# Patient Record
Sex: Female | Born: 1975 | Race: White | Hispanic: No | Marital: Married | Smoking: Never smoker
Health system: Southern US, Community
[De-identification: ages and names within clinical notes are randomized; demographics above are authoritative.]

## PROBLEM LIST (undated history)

## (undated) DIAGNOSIS — J302 Other seasonal allergic rhinitis: Secondary | ICD-10-CM

## (undated) DIAGNOSIS — D497 Neoplasm of unspecified behavior of endocrine glands and other parts of nervous system: Secondary | ICD-10-CM

## (undated) DIAGNOSIS — G43909 Migraine, unspecified, not intractable, without status migrainosus: Secondary | ICD-10-CM

## (undated) DIAGNOSIS — D259 Leiomyoma of uterus, unspecified: Secondary | ICD-10-CM

## (undated) DIAGNOSIS — Z973 Presence of spectacles and contact lenses: Secondary | ICD-10-CM

## (undated) HISTORY — DX: Migraine, unspecified, not intractable, without status migrainosus: G43.909

## (undated) HISTORY — PX: WISDOM TOOTH EXTRACTION: SHX21

---

## 2000-09-26 ENCOUNTER — Other Ambulatory Visit: Admission: RE | Admit: 2000-09-26 | Discharge: 2000-09-26 | Payer: Self-pay | Admitting: Obstetrics and Gynecology

## 2001-01-31 ENCOUNTER — Other Ambulatory Visit: Admission: RE | Admit: 2001-01-31 | Discharge: 2001-01-31 | Payer: Self-pay | Admitting: Obstetrics and Gynecology

## 2001-03-19 ENCOUNTER — Other Ambulatory Visit: Admission: RE | Admit: 2001-03-19 | Discharge: 2001-03-19 | Payer: Self-pay | Admitting: Obstetrics and Gynecology

## 2002-06-02 ENCOUNTER — Other Ambulatory Visit: Admission: RE | Admit: 2002-06-02 | Discharge: 2002-06-02 | Payer: Self-pay | Admitting: Obstetrics and Gynecology

## 2003-04-01 ENCOUNTER — Inpatient Hospital Stay (HOSPITAL_COMMUNITY): Admission: AD | Admit: 2003-04-01 | Discharge: 2003-04-02 | Payer: Self-pay | Admitting: Obstetrics and Gynecology

## 2003-04-02 ENCOUNTER — Inpatient Hospital Stay (HOSPITAL_COMMUNITY): Admission: AD | Admit: 2003-04-02 | Discharge: 2003-04-05 | Payer: Self-pay | Admitting: Obstetrics and Gynecology

## 2003-05-04 ENCOUNTER — Other Ambulatory Visit: Admission: RE | Admit: 2003-05-04 | Discharge: 2003-05-04 | Payer: Self-pay | Admitting: Obstetrics & Gynecology

## 2004-11-01 ENCOUNTER — Inpatient Hospital Stay (HOSPITAL_COMMUNITY): Admission: AD | Admit: 2004-11-01 | Discharge: 2004-11-03 | Payer: Self-pay | Admitting: Obstetrics and Gynecology

## 2004-12-04 ENCOUNTER — Other Ambulatory Visit: Admission: RE | Admit: 2004-12-04 | Discharge: 2004-12-04 | Payer: Self-pay | Admitting: Obstetrics & Gynecology

## 2013-08-27 ENCOUNTER — Encounter: Payer: Self-pay | Admitting: Gastroenterology

## 2013-09-10 ENCOUNTER — Encounter: Payer: Self-pay | Admitting: Gastroenterology

## 2013-09-10 ENCOUNTER — Ambulatory Visit (INDEPENDENT_AMBULATORY_CARE_PROVIDER_SITE_OTHER): Payer: BC Managed Care – PPO | Admitting: Gastroenterology

## 2013-09-10 VITALS — BP 112/78 | HR 68 | Ht 65.0 in | Wt 162.0 lb

## 2013-09-10 DIAGNOSIS — R0989 Other specified symptoms and signs involving the circulatory and respiratory systems: Secondary | ICD-10-CM

## 2013-09-10 DIAGNOSIS — F458 Other somatoform disorders: Secondary | ICD-10-CM

## 2013-09-10 DIAGNOSIS — R09A2 Foreign body sensation, throat: Secondary | ICD-10-CM | POA: Insufficient documentation

## 2013-09-10 NOTE — Progress Notes (Signed)
    _                                                                                                                History of Present Illness: Pleasant 38 year old white female self-referred for evaluation of globus.  She had an upper respiratory infection a couple months ago for which she ultimately took antibiotics.  Since that time she's had a constant need to clear her throat.  She has a sense of fullness in her lower throat.  She initially had a nonproductive cough.  She took Nexium for 4 days and did not notice much change.  She developed severe headache and nausea.  She rarely has pyrosis.  She denies dysphagia or sore throat.  She has no antecedent GI or upper respiratory complaints.  She complains of excess gas related to certain foods.   Past Medical History  Diagnosis Date  . Migraines    Past Surgical History  Procedure Laterality Date  . Wisdom tooth extraction     family history includes Bladder Cancer in her paternal grandmother; Breast cancer in her maternal aunt and paternal grandmother; Diabetes in her maternal grandmother; Heart disease in her maternal grandfather and paternal grandmother; Thyroid cancer in her maternal aunt and paternal uncle. Current Outpatient Prescriptions  Medication Sig Dispense Refill  . Multiple Minerals-Vitamins (CALCIUM & VIT D3 BONE HEALTH PO) Take 1 capsule by mouth daily.      . Probiotic Product (PROBIOTIC DAILY PO) Take 1 capsule by mouth daily.       No current facility-administered medications for this visit.   Allergies as of 09/10/2013  . (No Known Allergies)    reports that she has never smoked. She has never used smokeless tobacco. She reports that she does not drink alcohol or use illicit drugs.     Review of Systems: Pertinent positive and negative review of systems were noted in the above HPI section. All other review of systems were otherwise negative.  Vital signs were reviewed in today's medical  record Physical Exam: General: Well developed , well nourished, no acute distress Skin: anicteric Head: Normocephalic and atraumatic Eyes:  sclerae anicteric, EOMI Ears: Normal auditory acuity Mouth: No deformity or lesions Neck: Supple, no masses or thyromegaly Lungs: Clear throughout to auscultation Heart: Regular rate and rhythm; no murmurs, rubs or bruits Abdomen: Soft, non tender and non distended. No masses, hepatosplenomegaly or hernias noted. Normal Bowel sounds Rectal:deferred Musculoskeletal: Symmetrical with no gross deformities  Skin: No lesions on visible extremities Pulses:  Normal pulses noted Extremities: No clubbing, cyanosis, edema or deformities noted Neurological: Alert oriented x 4, grossly nonfocal Cervical Nodes:  No significant cervical adenopathy Inguinal Nodes: No significant inguinal adenopathy Psychological:  Alert and cooperative. Normal mood and affect  See Assessment and Plan under Problem List

## 2013-09-10 NOTE — Assessment & Plan Note (Signed)
Symptoms may be do to pharyngeal irritation.  Esophageal reflux is a consideration although she has no other symptoms suggestive of reflux including pyrosis, sore throat or coughing.  Short course of Nexium did not improve her symptoms and she had headache and nausea which could have been related to Nexium.  She does not appear particularly anxious.   Recommendations #1 trial of guafenesin.  If she's not improved after 4-5 days I will empirically try her on another PPI.  Finally, if she is unresponsive to either therapy I will proceed with upper endoscopy with bravo 48-hour pH study off medications

## 2013-09-10 NOTE — Patient Instructions (Signed)
Use guaifenesin OTC  Call in one week to report progres

## 2015-08-14 HISTORY — PX: OTHER SURGICAL HISTORY: SHX169

## 2016-07-02 ENCOUNTER — Other Ambulatory Visit: Payer: Self-pay | Admitting: Family Medicine

## 2016-07-02 DIAGNOSIS — E041 Nontoxic single thyroid nodule: Secondary | ICD-10-CM

## 2016-07-11 ENCOUNTER — Ambulatory Visit
Admission: RE | Admit: 2016-07-11 | Discharge: 2016-07-11 | Disposition: A | Discharge status: Home / Self Care | Payer: BLUE CROSS/BLUE SHIELD | Source: Ambulatory Visit | Attending: Family Medicine | Admitting: Family Medicine

## 2016-07-11 DIAGNOSIS — E041 Nontoxic single thyroid nodule: Secondary | ICD-10-CM

## 2016-07-13 ENCOUNTER — Other Ambulatory Visit: Payer: Self-pay | Admitting: Family Medicine

## 2016-07-13 DIAGNOSIS — E041 Nontoxic single thyroid nodule: Secondary | ICD-10-CM

## 2016-07-18 ENCOUNTER — Ambulatory Visit
Admission: RE | Admit: 2016-07-18 | Discharge: 2016-07-18 | Disposition: A | Discharge status: Home / Self Care | Payer: BLUE CROSS/BLUE SHIELD | Source: Ambulatory Visit | Attending: Family Medicine | Admitting: Family Medicine

## 2016-07-18 ENCOUNTER — Other Ambulatory Visit (HOSPITAL_COMMUNITY)
Admission: RE | Admit: 2016-07-18 | Discharge: 2016-07-18 | Disposition: A | Discharge status: Home / Self Care | Payer: BLUE CROSS/BLUE SHIELD | Source: Ambulatory Visit | Attending: Radiology | Admitting: Radiology

## 2016-07-18 DIAGNOSIS — E041 Nontoxic single thyroid nodule: Secondary | ICD-10-CM | POA: Diagnosis not present

## 2016-07-18 DIAGNOSIS — D440 Neoplasm of uncertain behavior of thyroid gland: Secondary | ICD-10-CM | POA: Insufficient documentation

## 2016-07-23 ENCOUNTER — Ambulatory Visit: Payer: Self-pay | Admitting: Surgery

## 2016-08-10 ENCOUNTER — Encounter (HOSPITAL_COMMUNITY): Payer: Self-pay | Admitting: *Deleted

## 2016-08-13 DIAGNOSIS — C73 Malignant neoplasm of thyroid gland: Secondary | ICD-10-CM

## 2016-08-13 DIAGNOSIS — E890 Postprocedural hypothyroidism: Secondary | ICD-10-CM

## 2016-08-13 HISTORY — DX: Postprocedural hypothyroidism: E89.0

## 2016-08-13 HISTORY — DX: Malignant neoplasm of thyroid gland: C73

## 2016-08-22 DIAGNOSIS — Z808 Family history of malignant neoplasm of other organs or systems: Secondary | ICD-10-CM | POA: Diagnosis not present

## 2016-08-22 DIAGNOSIS — D224 Melanocytic nevi of scalp and neck: Secondary | ICD-10-CM | POA: Diagnosis not present

## 2016-08-22 DIAGNOSIS — D225 Melanocytic nevi of trunk: Secondary | ICD-10-CM | POA: Diagnosis not present

## 2016-08-22 DIAGNOSIS — Z86018 Personal history of other benign neoplasm: Secondary | ICD-10-CM | POA: Diagnosis not present

## 2016-08-23 NOTE — Patient Instructions (Signed)
Diane Russell  08/23/2016   Your procedure is scheduled [id number] 08/29/2015   Report to [facility] Main  Entrance take [place]  elevators to 3rd floor to  [place] at   [place]  AM.  Call this number if you have problems the morning of surgery [phone]   Remember: ONLY 1 PERSON MAY GO WITH YOU TO SHORT STAY TO GET  READY MORNING OF [place].   Do not eat food or drink liquids :After Midnight.     Take these medicines the morning of surgery with A SIP OF WATER: Flonase nasal spray                                 You may not have any metal on your body including hair pins and              piercings  Do not wear jewelry, make-up, lotions, powders or perfumes, deodorant             Do not wear nail polish.  Do not shave  48 hours prior to surgery.              Men may shave face and neck.   Do not bring valuables to the hospital. [place].  Contacts, dentures or bridgework may not be worn into surgery.  Leave suitcase in the car. After surgery it may be brought to your room.                  Please read over the following fact sheets you were given: _____________________________________________________________________             [facility] - Preparing for Surgery Before surgery, you can play an important role.  Because skin is not sterile, your skin needs to be as free of germs as possible.  You can reduce the number of germs on your skin by washing with CHG (chlorahexidine gluconate) soap before surgery.  CHG is an antiseptic cleaner which kills germs and bonds with the skin to continue killing germs even after washing. Please DO NOT use if you have an allergy to CHG or antibacterial soaps.  If your skin becomes reddened/irritated stop using the CHG and inform your nurse when you arrive at Short Stay. Do not shave (including legs and underarms) for at least 48 hours prior to the first  CHG shower.  You may shave your face/neck. Please follow these instructions carefully:  1.  Shower with CHG Soap the night before surgery and the  morning of Surgery.  2.  If you choose to wash your hair, wash your hair first as usual with your  normal  shampoo.  3.  After you shampoo, rinse your hair and body thoroughly to remove the  shampoo.                           4.  Use CHG as you would any other liquid soap.  You can apply chg directly  to the skin and wash                       Gently with a scrungie or clean  washcloth.  5.  Apply the CHG Soap to your body ONLY FROM THE NECK DOWN.   Do not use on face/ open                           Wound or open sores. Avoid contact with eyes, ears mouth and genitals (private parts).                       Wash face,  Genitals (private parts) with your normal soap.             6.  Wash thoroughly, paying special attention to the area where your surgery  will be performed.  7.  Thoroughly rinse your body with warm water from the neck down.  8.  DO NOT shower/wash with your normal soap after using and rinsing off  the CHG Soap.                9.  Pat yourself dry with a clean towel.            10.  Wear clean pajamas.            11.  Place clean sheets on your bed the night of your first shower and do not  sleep with pets. Day of Surgery : Do not apply any lotions/deodorants the morning of surgery.  Please wear clean clothes to the hospital/surgery center.  FAILURE TO FOLLOW THESE INSTRUCTIONS MAY RESULT IN THE CANCELLATION OF YOUR SURGERY PATIENT SIGNATURE_________________________________  NURSE SIGNATURE__________________________________  ________________________________________________________________________

## 2016-08-24 ENCOUNTER — Ambulatory Visit (HOSPITAL_COMMUNITY)
Admission: RE | Admit: 2016-08-24 | Discharge: 2016-08-24 | Disposition: A | Discharge status: Home / Self Care | Payer: BLUE CROSS/BLUE SHIELD | Source: Ambulatory Visit | Attending: Anesthesiology | Admitting: Anesthesiology

## 2016-08-24 ENCOUNTER — Encounter (HOSPITAL_COMMUNITY): Payer: Self-pay

## 2016-08-24 ENCOUNTER — Encounter (HOSPITAL_COMMUNITY)
Admission: RE | Admit: 2016-08-24 | Discharge: 2016-08-24 | Disposition: A | Discharge status: Home / Self Care | Payer: BLUE CROSS/BLUE SHIELD | Source: Ambulatory Visit | Attending: Surgery | Admitting: Surgery

## 2016-08-24 DIAGNOSIS — Z01811 Encounter for preprocedural respiratory examination: Secondary | ICD-10-CM | POA: Diagnosis not present

## 2016-08-24 DIAGNOSIS — D440 Neoplasm of uncertain behavior of thyroid gland: Secondary | ICD-10-CM | POA: Insufficient documentation

## 2016-08-24 DIAGNOSIS — Z01812 Encounter for preprocedural laboratory examination: Secondary | ICD-10-CM | POA: Insufficient documentation

## 2016-08-24 DIAGNOSIS — Z01818 Encounter for other preprocedural examination: Secondary | ICD-10-CM | POA: Diagnosis not present

## 2016-08-24 HISTORY — DX: Neoplasm of unspecified behavior of endocrine glands and other parts of nervous system: D49.7

## 2016-08-24 LAB — CBC
HEMATOCRIT: 40.9 % (ref 36.0–46.0)
Hemoglobin: 13.7 g/dL (ref 12.0–15.0)
MCH: 28.1 pg (ref 26.0–34.0)
MCHC: 33.5 g/dL (ref 30.0–36.0)
MCV: 83.8 fL (ref 78.0–100.0)
Platelets: 302 10*3/uL (ref 150–400)
RBC: 4.88 MIL/uL (ref 3.87–5.11)
RDW: 12.5 % (ref 11.5–15.5)
WBC: 4.7 10*3/uL (ref 4.0–10.5)

## 2016-08-24 LAB — HCG, SERUM, QUALITATIVE: PREG SERUM: NEGATIVE

## 2016-08-27 ENCOUNTER — Encounter (HOSPITAL_COMMUNITY): Payer: Self-pay | Admitting: Surgery

## 2016-08-27 DIAGNOSIS — D440 Neoplasm of uncertain behavior of thyroid gland: Secondary | ICD-10-CM | POA: Diagnosis present

## 2016-08-27 NOTE — Anesthesia Preprocedure Evaluation (Signed)
Anesthesia Evaluation  Patient identified by MRN, date of birth, ID band Patient awake    Reviewed: Allergy & Precautions, H&P , Patient's Chart, lab work & pertinent test results, reviewed documented beta blocker date and time   Airway Mallampati: II  TM Distance: >3 FB Neck ROM: full    Dental no notable dental hx.    Pulmonary    Pulmonary exam normal breath sounds clear to auscultation       Cardiovascular  Rhythm:regular Rate:Normal     Neuro/Psych    GI/Hepatic   Endo/Other    Renal/GU      Musculoskeletal   Abdominal   Peds  Hematology   Anesthesia Other Findings o/w healthy  Reproductive/Obstetrics                             Anesthesia Physical Anesthesia Plan  ASA: II  Anesthesia Plan: General   Post-op Pain Management:    Induction: Intravenous  Airway Management Planned: Oral ETT  Additional Equipment:   Intra-op Plan:   Post-operative Plan: Extubation in OR  Informed Consent: I have reviewed the patients History and Physical, chart, labs and discussed the procedure including the risks, benefits and alternatives for the proposed anesthesia with the patient or authorized representative who has indicated his/her understanding and acceptance.   Dental Advisory Given and Dental advisory given  Plan Discussed with: CRNA and Surgeon  Anesthesia Plan Comments: (  Discussed general anesthesia, including possible nausea, instrumentation of airway, sore throat,pulmonary aspiration, etc. I asked if the were any outstanding questions, or  concerns before we proceeded.)        Anesthesia Quick Evaluation

## 2016-08-27 NOTE — H&P (Signed)
General Surgery [facility]  Diane Russell DOB: May 06, 1976 Married / Language: English / Race: White Female  History of Present Illness   The patient is a 41 year old female who presents with a thyroid nodule.  Patient is referred by Dr. Ulanda Edison for evaluation of right thyroid nodule suspicious for malignancy. Patient first noted a thyroid nodule on self-examination in November 2017. She notified her primary care physician and an ultrasound was obtained on July 11, 2016. This demonstrated a normal size thyroid gland with essentially a solitary nodule in the mid right thyroid lobe measuring 2.0 cm in greatest dimension. Fine-needle aspiration biopsy was carried out on July 18, 2016. This showed a suspicion of malignancy, Bethesda category V. Patient presents today to discuss surgical resection for definitive diagnosis. She is accompanied by her husband. 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 Patient has never been on thyroid medication. She has had no prior head or neck surgery. Patient denies any compressive symptoms. She denies tremors or palpitations. TSH level is normal at 1.79. Patient does have a family history of thyroid disease with her mother being treated for nodules, a maternal aunt with papillary thyroid cancer, and a maternal uncle with an unknown head and neck tumor. Patient presents today to discuss surgical resection for definitive diagnosis.   Past Surgical History Oral Surgery   Diagnostic Studies History  Colonoscopy  never Mammogram  within last year Pap Smear  1-5 years ago  Allergies  No Known Drug Allergies 07/23/2016  Medication History Magnesium (500MG  Tablet, Oral) Active. Medications Reconciled  Social History Alcohol use  Occasional alcohol use. Caffeine use  Tea. No drug use  Tobacco use  Never smoker.  Family History  Breast Cancer  Family Members In General. Cancer   Family Members In General. Diabetes Mellitus  Family Members In General, Father. Heart Disease  Family Members In General. Heart disease in female family member before age 42  Hypertension  Family Members In General, Father. Melanoma  Family Members In General. Thyroid problems  Mother.  Pregnancy / Birth History Age at menarche  78 years. Contraceptive History  Intrauterine device. Gravida  2 Length (months) of breastfeeding  3-6 Maternal age  54-30 Para  2 Regular periods   Other Problems  Hypercholesterolemia  Thyroid Disease   Review of Systems  General Present- Fatigue, Night Sweats and Weight Gain. Not Present- Appetite Loss, Chills, Fever and Weight Loss. Skin Present- Dryness. Not Present- Change in Wart/Mole, Hives, Jaundice, New Lesions, Non-Healing Wounds, Rash and Ulcer. HEENT Present- Seasonal Allergies, Sinus Pain and Wears glasses/contact lenses. Not Present- Earache, Hearing Loss, Hoarseness, Nose Bleed, Oral Ulcers, Ringing in the Ears, Sore Throat, Visual Disturbances and Yellow Eyes. Cardiovascular Present- Leg Cramps. Not Present- Chest Pain, Difficulty Breathing Lying Down, Palpitations, Rapid Heart Rate, Shortness of Breath and Swelling of Extremities. Gastrointestinal Not Present- Abdominal Pain, Bloating, Bloody Stool, Change in Bowel Habits, Chronic diarrhea, Constipation, Difficulty Swallowing, Excessive gas, Gets full quickly at meals, Hemorrhoids, Indigestion, Nausea, Rectal Pain and Vomiting. Female Genitourinary Not Present- Frequency, Nocturia, Painful Urination, Pelvic Pain and Urgency. Musculoskeletal Not Present- Back Pain, Joint Pain, Joint Stiffness, Muscle Pain, Muscle Weakness and Swelling of Extremities. Neurological Present- Headaches. Not Present- Decreased Memory, Fainting, Numbness, Seizures, Tingling, Tremor, Trouble walking and Weakness. Psychiatric Not Present- Anxiety, Bipolar, Change in Sleep Pattern, Depression, Fearful and  Frequent crying. Endocrine Present- Cold Intolerance and Hair Changes. Not Present- Excessive Hunger, Heat Intolerance, Hot flashes and  New Diabetes.  Vitals  Weight: 161.4 lb Height: 65in Body Surface Area: 1.81 m Body Mass Index: 26.86 kg/m  Temp.: 98.30F(Oral)  Pulse: 83 (Regular)  BP: 118/74 (Sitting, Left Arm, Standard)  Physical Exam The physical exam findings are as follows: Note:General - appears comfortable, no distress; not diaphorectic  HEENT - normocephalic; sclerae clear, gaze conjugate; mucous membranes moist, dentition good; voice normal  Neck - asymmetric on extension; no palpable anterior or posterior cervical adenopathy; left lobe is normal to palpation; in the mid anterior right thyroid lobe is a 2 cm mass which is smooth, relatively soft, mobile, and nontender; there are no other palpable masses noted.  Chest - clear bilaterally without rhonchi, rales, or wheeze  Cor - regular rhythm with normal rate; no significant murmur  Ext - non-tender without significant edema or lymphedema  Neuro - grossly intact; no tremor    Assessment & Plan  NEOPLASM OF UNCERTAIN BEHAVIOR OF THYROID GLAND (D44.0)  Pt Education - Pamphlet Given - The Thyroid Book: discussed with patient and provided information. Patient presents today accompanied by her husband to discuss right thyroid nodule suspicious for malignancy. Patient is provided with written literature on thyroid surgery to review at home.  We reviewed her thyroid ultrasound and fine-needle aspiration cytopathology report in detail. Copies are provided to the patient. We discussed options for surgical management including right thyroid lobectomy versus total thyroidectomy with possible lymph node dissection. We discussed the risk and benefits of each including the potential for recurrent laryngeal nerve injury and injury to parathyroid glands. After discussion, the patient was like to proceed with right  thyroid lobectomy. She is aware that additional surgery may be required based on final pathology results. We discussed the hospital stay to be anticipated. We discussed the surgical incision and the cosmetic results to be anticipated. We discussed the need for consultation with endocrinology following surgery. I have recommended Dr. Delrae Rend in the [place] group. Patient understands and agrees to proceed.  The risks and benefits of the procedure have been discussed at length with the patient. The patient understands the proposed procedure, potential alternative treatments, and the course of recovery to be expected. All of the patient's questions have been answered at this time. The patient wishes to proceed with surgery.  Earnstine Regal, MD, [facility] Office: [phone]

## 2016-08-28 ENCOUNTER — Ambulatory Visit (HOSPITAL_COMMUNITY): Payer: BLUE CROSS/BLUE SHIELD | Admitting: Anesthesiology

## 2016-08-28 ENCOUNTER — Encounter (HOSPITAL_COMMUNITY)
Admission: RE | Disposition: A | Discharge status: Home / Self Care | Payer: Self-pay | Source: Ambulatory Visit | Attending: Surgery

## 2016-08-28 ENCOUNTER — Observation Stay (HOSPITAL_COMMUNITY)
Admission: RE | Admit: 2016-08-28 | Discharge: 2016-08-29 | Disposition: A | Discharge status: Home / Self Care | Payer: BLUE CROSS/BLUE SHIELD | Source: Ambulatory Visit | Attending: Surgery | Admitting: Surgery

## 2016-08-28 ENCOUNTER — Encounter (HOSPITAL_COMMUNITY): Payer: Self-pay | Admitting: Anesthesiology

## 2016-08-28 DIAGNOSIS — Z803 Family history of malignant neoplasm of breast: Secondary | ICD-10-CM | POA: Diagnosis not present

## 2016-08-28 DIAGNOSIS — D440 Neoplasm of uncertain behavior of thyroid gland: Secondary | ICD-10-CM | POA: Diagnosis not present

## 2016-08-28 DIAGNOSIS — Z8249 Family history of ischemic heart disease and other diseases of the circulatory system: Secondary | ICD-10-CM | POA: Diagnosis not present

## 2016-08-28 DIAGNOSIS — D34 Benign neoplasm of thyroid gland: Secondary | ICD-10-CM | POA: Insufficient documentation

## 2016-08-28 DIAGNOSIS — Z809 Family history of malignant neoplasm, unspecified: Secondary | ICD-10-CM | POA: Diagnosis not present

## 2016-08-28 DIAGNOSIS — Z808 Family history of malignant neoplasm of other organs or systems: Secondary | ICD-10-CM | POA: Diagnosis not present

## 2016-08-28 DIAGNOSIS — C73 Malignant neoplasm of thyroid gland: Secondary | ICD-10-CM | POA: Diagnosis not present

## 2016-08-28 DIAGNOSIS — Z833 Family history of diabetes mellitus: Secondary | ICD-10-CM | POA: Diagnosis not present

## 2016-08-28 DIAGNOSIS — Z79899 Other long term (current) drug therapy: Secondary | ICD-10-CM | POA: Diagnosis not present

## 2016-08-28 HISTORY — PX: THYROID LOBECTOMY: SHX420

## 2016-08-28 SURGERY — LOBECTOMY, THYROID
Anesthesia: General | Laterality: Right

## 2016-08-28 MED ORDER — SUGAMMADEX SODIUM 200 MG/2ML IV SOLN
INTRAVENOUS | Status: AC
  Filled 2016-08-28: qty 2

## 2016-08-28 MED ORDER — FENTANYL CITRATE (PF) 100 MCG/2ML IJ SOLN
INTRAMUSCULAR | Status: AC
Start: 2016-08-28 — End: 2016-08-28
  Filled 2016-08-28: qty 2

## 2016-08-28 MED ORDER — ROCURONIUM BROMIDE 10 MG/ML (PF) SYRINGE
PREFILLED_SYRINGE | INTRAVENOUS | Status: DC | PRN
  Administered 2016-08-28: 40 mg via INTRAVENOUS
  Administered 2016-08-28: 10 mg via INTRAVENOUS

## 2016-08-28 MED ORDER — CHLORHEXIDINE GLUCONATE CLOTH 2 % EX PADS: 6.0000 | MEDICATED_PAD | Freq: Once | CUTANEOUS | Status: DC

## 2016-08-28 MED ORDER — CEFAZOLIN SODIUM-DEXTROSE 2-4 GM/100ML-% IV SOLN
INTRAVENOUS | Status: AC
  Filled 2016-08-28: qty 100

## 2016-08-28 MED ORDER — DEXAMETHASONE SODIUM PHOSPHATE 10 MG/ML IJ SOLN
INTRAMUSCULAR | Status: DC | PRN
  Administered 2016-08-28: 10 mg via INTRAVENOUS

## 2016-08-28 MED ORDER — HYDROCODONE-ACETAMINOPHEN 5-325 MG PO TABS
1.0000 | ORAL_TABLET | ORAL | Status: DC | PRN
  Administered 2016-08-29: 1 via ORAL
  Filled 2016-08-28: qty 1

## 2016-08-28 MED ORDER — HYDROMORPHONE HCL 2 MG/ML IJ SOLN
1.0000 mg | INTRAMUSCULAR | Status: DC | PRN
  Administered 2016-08-28: 1 mg via INTRAVENOUS
  Filled 2016-08-28: qty 1

## 2016-08-28 MED ORDER — KCL IN DEXTROSE-NACL 20-5-0.45 MEQ/L-%-% IV SOLN
INTRAVENOUS | Status: DC
  Administered 2016-08-28: 11:00:00 via INTRAVENOUS
  Filled 2016-08-28 (×2): qty 1000

## 2016-08-28 MED ORDER — SUGAMMADEX SODIUM 200 MG/2ML IV SOLN
INTRAVENOUS | Status: DC | PRN
  Administered 2016-08-28: 150 mg via INTRAVENOUS

## 2016-08-28 MED ORDER — MIDAZOLAM HCL 2 MG/2ML IJ SOLN
INTRAMUSCULAR | Status: DC | PRN
  Administered 2016-08-28: 2 mg via INTRAVENOUS

## 2016-08-28 MED ORDER — DEXAMETHASONE SODIUM PHOSPHATE 10 MG/ML IJ SOLN
INTRAMUSCULAR | Status: AC
  Filled 2016-08-28: qty 1

## 2016-08-28 MED ORDER — LIDOCAINE HCL 4 % MT SOLN
OROMUCOSAL | Status: DC | PRN
  Administered 2016-08-28: 4 mL via TOPICAL

## 2016-08-28 MED ORDER — CEFAZOLIN SODIUM-DEXTROSE 2-4 GM/100ML-% IV SOLN
2.0000 g | INTRAVENOUS | Status: AC
  Administered 2016-08-28: 2 g via INTRAVENOUS

## 2016-08-28 MED ORDER — LACTATED RINGERS IV SOLN
INTRAVENOUS | Status: DC
  Administered 2016-08-28: 09:00:00 via INTRAVENOUS

## 2016-08-28 MED ORDER — FENTANYL CITRATE (PF) 100 MCG/2ML IJ SOLN
25.0000 ug | INTRAMUSCULAR | Status: DC | PRN
  Administered 2016-08-28 (×2): 50 ug via INTRAVENOUS

## 2016-08-28 MED ORDER — FENTANYL CITRATE (PF) 100 MCG/2ML IJ SOLN
INTRAMUSCULAR | Status: DC | PRN
  Administered 2016-08-28: 50 ug via INTRAVENOUS
  Administered 2016-08-28: 100 ug via INTRAVENOUS

## 2016-08-28 MED ORDER — FENTANYL CITRATE (PF) 100 MCG/2ML IJ SOLN
INTRAMUSCULAR | Status: AC
  Filled 2016-08-28: qty 4

## 2016-08-28 MED ORDER — LACTATED RINGERS IV SOLN
INTRAVENOUS | Status: DC | PRN
  Administered 2016-08-28: 07:00:00 via INTRAVENOUS

## 2016-08-28 MED ORDER — ONDANSETRON HCL 4 MG/2ML IJ SOLN: 4.0000 mg | Freq: Four times a day (QID) | INTRAMUSCULAR | Status: DC | PRN

## 2016-08-28 MED ORDER — MIDAZOLAM HCL 2 MG/2ML IJ SOLN
INTRAMUSCULAR | Status: AC
  Filled 2016-08-28: qty 2

## 2016-08-28 MED ORDER — ONDANSETRON 4 MG PO TBDP: 4.0000 mg | ORAL_TABLET | Freq: Four times a day (QID) | ORAL | Status: DC | PRN

## 2016-08-28 MED ORDER — ACETAMINOPHEN 325 MG PO TABS: 650.0000 mg | ORAL_TABLET | Freq: Four times a day (QID) | ORAL | Status: DC | PRN

## 2016-08-28 MED ORDER — LIDOCAINE 2% (20 MG/ML) 5 ML SYRINGE
INTRAMUSCULAR | Status: AC
  Filled 2016-08-28: qty 5

## 2016-08-28 MED ORDER — 0.9 % SODIUM CHLORIDE (POUR BTL) OPTIME
TOPICAL | Status: DC | PRN
  Administered 2016-08-28: 1000 mL

## 2016-08-28 MED ORDER — PROPOFOL 10 MG/ML IV BOLUS
INTRAVENOUS | Status: DC | PRN
  Administered 2016-08-28: 150 mg via INTRAVENOUS

## 2016-08-28 MED ORDER — ONDANSETRON HCL 4 MG/2ML IJ SOLN
INTRAMUSCULAR | Status: AC
  Filled 2016-08-28: qty 2

## 2016-08-28 MED ORDER — ONDANSETRON HCL 4 MG/2ML IJ SOLN
INTRAMUSCULAR | Status: DC | PRN
  Administered 2016-08-28: 4 mg via INTRAVENOUS

## 2016-08-28 MED ORDER — ROCURONIUM BROMIDE 50 MG/5ML IV SOSY
PREFILLED_SYRINGE | INTRAVENOUS | Status: AC
  Filled 2016-08-28: qty 5

## 2016-08-28 MED ORDER — PROPOFOL 10 MG/ML IV BOLUS
INTRAVENOUS | Status: AC
  Filled 2016-08-28: qty 20

## 2016-08-28 MED ORDER — SUCCINYLCHOLINE CHLORIDE 200 MG/10ML IV SOSY
PREFILLED_SYRINGE | INTRAVENOUS | Status: AC
  Filled 2016-08-28: qty 10

## 2016-08-28 MED ORDER — ACETAMINOPHEN 650 MG RE SUPP: 650.0000 mg | Freq: Four times a day (QID) | RECTAL | Status: DC | PRN

## 2016-08-28 SURGICAL SUPPLY — 38 items
APL SKNCLS STERI-STRIP NONHPOA (GAUZE/BANDAGES/DRESSINGS)
ATTRACTOMAT 16X20 MAGNETIC DRP (DRAPES) ×3 IMPLANT
BENZOIN TINCTURE PRP APPL 2/3 (GAUZE/BANDAGES/DRESSINGS) IMPLANT
BLADE SURG 15 STRL LF DISP TIS (BLADE) ×1 IMPLANT
BLADE SURG 15 STRL SS (BLADE) ×3
CHLORAPREP W/TINT 26ML (MISCELLANEOUS) ×3 IMPLANT
CLIP TI MEDIUM 6 (CLIP) ×6 IMPLANT
CLIP TI WIDE RED SMALL 6 (CLIP) ×6 IMPLANT
CLOSURE WOUND 1/2 X4 (GAUZE/BANDAGES/DRESSINGS) ×1
COVER SURGICAL LIGHT HANDLE (MISCELLANEOUS) ×3 IMPLANT
DISSECTOR ROUND CHERRY 3/8 STR (MISCELLANEOUS) IMPLANT
DRAPE LAPAROTOMY T 98X78 PEDS (DRAPES) ×3 IMPLANT
ELECT PENCIL ROCKER SW 15FT (MISCELLANEOUS) ×3 IMPLANT
ELECT REM PT RETURN 9FT ADLT (ELECTROSURGICAL) ×3
ELECTRODE REM PT RTRN 9FT ADLT (ELECTROSURGICAL) ×1 IMPLANT
GAUZE SPONGE 4X4 12PLY STRL (GAUZE/BANDAGES/DRESSINGS) ×3 IMPLANT
GAUZE SPONGE 4X4 16PLY XRAY LF (GAUZE/BANDAGES/DRESSINGS) ×3 IMPLANT
GLOVE SURG ORTHO 8.0 STRL STRW (GLOVE) ×3 IMPLANT
GOWN STRL REUS W/TWL XL LVL3 (GOWN DISPOSABLE) ×6 IMPLANT
HEMOSTAT SURGICEL 2X4 FIBR (HEMOSTASIS) ×3 IMPLANT
ILLUMINATOR WAVEGUIDE N/F (MISCELLANEOUS) ×3 IMPLANT
KIT BASIN OR (CUSTOM PROCEDURE TRAY) ×3 IMPLANT
LIGHT WAVEGUIDE WIDE FLAT (MISCELLANEOUS) IMPLANT
PACK BASIC VI WITH GOWN DISP (CUSTOM PROCEDURE TRAY) ×3 IMPLANT
SHEARS HARMONIC 9CM CVD (BLADE) ×3 IMPLANT
STAPLER VISISTAT 35W (STAPLE) ×3 IMPLANT
STRIP CLOSURE SKIN 1/2X4 (GAUZE/BANDAGES/DRESSINGS) ×2 IMPLANT
SUT MNCRL AB 4-0 PS2 18 (SUTURE) ×3 IMPLANT
SUT SILK 2 0 (SUTURE) ×3
SUT SILK 2-0 18XBRD TIE 12 (SUTURE) ×1 IMPLANT
SUT SILK 3 0 (SUTURE)
SUT SILK 3-0 18XBRD TIE 12 (SUTURE) IMPLANT
SUT VIC AB 3-0 SH 18 (SUTURE) ×6 IMPLANT
SYR BULB IRRIGATION 50ML (SYRINGE) ×3 IMPLANT
TAPE PAPER 3X10 WHT MICROPORE (GAUZE/BANDAGES/DRESSINGS) ×3 IMPLANT
TOWEL OR 17X26 10 PK STRL BLUE (TOWEL DISPOSABLE) ×3 IMPLANT
TOWEL OR NON WOVEN STRL DISP B (DISPOSABLE) ×3 IMPLANT
YANKAUER SUCT BULB TIP 10FT TU (MISCELLANEOUS) ×3 IMPLANT

## 2016-08-28 NOTE — Anesthesia Postprocedure Evaluation (Signed)
Anesthesia Post Note  Patient: Diane Russell  Procedure(s) Performed: Procedure(s) (LRB): RIGHT THYROID LOBECTOMY (Right)  Patient location during evaluation: PACU Anesthesia Type: General Level of consciousness: sedated Pain management: satisfactory to patient Vital Signs Assessment: post-procedure vital signs reviewed and stable Respiratory status: spontaneous breathing Cardiovascular status: stable Anesthetic complications: no       Last Vitals:  Vitals:   08/28/16 1200 08/28/16 1300  BP: 120/68 120/64  Pulse: 80 76  Resp: 16 16  Temp: 36.6 C 36.7 C    Last Pain:  Vitals:   08/28/16 1300  TempSrc: Oral  PainSc:                  Riccardo Dubin

## 2016-08-28 NOTE — Op Note (Signed)
Procedure Note  Pre-operative Diagnosis:  Thyroid neoplasm of uncertain behavior, right medial lobe  Post-operative Diagnosis:  same  Surgeon:  Earnstine Regal, MD, FACS  Assistant:  none   Procedure:  Right thyroid lobectomy and isthmusectomy  Anesthesia:  General  Estimated Blood Loss:  minimal  Drains: none         Specimen: thyroid lobe to pathology  Indications:  The patient is a 41 year old female who presents with a thyroid nodule.  Patient is referred by Dr. Ulanda Edison for evaluation of right thyroid nodule suspicious for malignancy. Patient first noted a thyroid nodule on self-examination in November 2017. She notified her primary care physician and an ultrasound was obtained on July 11, 2016. This demonstrated a normal size thyroid gland with essentially a solitary nodule in the mid right thyroid lobe measuring 2.0 cm in greatest dimension. Fine-needle aspiration biopsy was carried out on July 18, 2016. This showed a suspicion of malignancy, Bethesda category V. Patient presents today to discuss surgical resection for definitive diagnosis. She is accompanied by her husband. 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 Patient has never been on thyroid medication. She has had no prior head or neck surgery. Patient denies any compressive symptoms. She denies tremors or palpitations. TSH level is normal at 1.79. Patient does have a family history of thyroid disease with her mother being treated for nodules, a maternal aunt with papillary thyroid cancer, and a maternal uncle with an unknown head and neck tumor. Patient presents today to discuss surgical resection for definitive diagnosis.   Procedure Details: Procedure was done in OR #2 at the [facility].  The patient was brought to the operating room and placed in a supine position on the operating room table.  Following administration of general anesthesia, the patient was positioned and then  prepped and draped in the usual aseptic fashion.  After ascertaining that an adequate level of anesthesia had been achieved, a Kocher incision was made with #15 blade.  Dissection was carried through subcutaneous tissues and platysma. Hemostasis was achieved with the electrocautery.  Skin flaps were elevated cephalad and caudad from the thyroid notch to the sternal notch.  A self-retaining retractor was placed for exposure.  Strap muscles were incised in the midline and dissection was begun on the right side.  Strap muscles were reflected laterally.  The right thyroid lobe was normal in size with a dominant nodule located medially and into the right side of the isthmus.  The lobe was gently mobilized with blunt dissection.  Superior pole vessels were dissected out and divided individually between small and medium Ligaclips with the Harmonic scalpel.  The thyroid lobe was rolled anteriorly.  Branches of the inferior thyroid artery were divided between small Ligaclips with the Harmonic scalpel.  Inferior venous tributaries were divided between Ligaclips.  Both the superior and inferior parathyroid glands were identified and preserved on their vascular pedicles.  The recurrent laryngeal nerve was identified and preserved along its course.  The ligament of Gwenlyn Found was released with the electrocautery and the gland was mobilized onto the anterior trachea. Isthmus was mobilized across the midline.  There was a small thin pyramidal lobe present which was resected with the thyroid isthmus.  The thyroid parenchyma was transected at the junction of the isthmus and contralateral thyroid lobe with the Harmonic scalpel.  The thyroid lobe and isthmus were submitted to pathology for review.  The neck was irrigated with warm saline.  Fibular  was placed throughout the operative field.  Strap muscles were reapproximated in the midline with interrupted 3-0 Vicryl sutures.  Platysma was closed with interrupted 3-0 Vicryl sutures.  Skin  was closed with a running 4-0 Monocryl subcuticular suture.  Wound was washed and dried and steri-strips were applied.  Dry gauze dressing was placed.  The patient was awakened from anesthesia and brought to the recovery room.  The patient tolerated the procedure well.   Earnstine Regal, MD, [facility] Office: [phone]

## 2016-08-28 NOTE — Interval H&P Note (Signed)
History and Physical Interval Note:  08/28/2016 7:18 AM  Diane Russell  has presented today for surgery, with the diagnosis of thyroid neoplasm of uncertain behavior  The various methods of treatment have been discussed with the patient and family. After consideration of risks, benefits and other options for treatment, the patient has consented to    Procedure(s): RIGHT THYROID LOBECTOMY (Right) as a surgical intervention .    The patient's history has been reviewed, patient examined, no change in status, stable for surgery.  I have reviewed the patient's chart and labs.  Questions were answered to the patient's satisfaction.    Earnstine Regal, MD, [facility] Office: [place]

## 2016-08-28 NOTE — Transfer of Care (Signed)
Immediate Anesthesia Transfer of Care Note  Patient: Diane Russell  Procedure(s) Performed: Procedure(s): RIGHT THYROID LOBECTOMY (Right)  Patient Location: PACU  Anesthesia Type:General  Level of Consciousness: awake, alert  and oriented  Airway & Oxygen Therapy: Patient Spontanous Breathing and Patient connected to face mask oxygen  Post-op Assessment: Report given to RN and Post -op Vital signs reviewed and stable  Post vital signs: Reviewed and stable  Last Vitals:  Vitals:   08/28/16 0540  BP: 140/89  Pulse: (!) 101  Resp: 16  Temp: 36.8 C    Last Pain:  Vitals:   08/28/16 0540  TempSrc: Oral      Patients Stated Pain Goal: 4 ([id number])  Complications: No apparent anesthesia complications

## 2016-08-28 NOTE — Anesthesia Procedure Notes (Signed)
Procedure Name: Intubation Date/Time: 08/28/2016 7:29 AM Performed by: Danley Danker L Patient Re-evaluated:Patient Re-evaluated prior to inductionOxygen Delivery Method: Circle system utilized Preoxygenation: Pre-oxygenation with 100% oxygen Intubation Type: IV induction Ventilation: Mask ventilation without difficulty and Oral airway inserted - appropriate to patient size Laryngoscope Size: Sabra Heck and 2 Grade View: Grade I Tube type: Reinforced Tube size: 7.0 mm Number of attempts: 1 Airway Equipment and Method: Stylet and LTA kit utilized Placement Confirmation: ETT inserted through vocal cords under direct vision,  positive ETCO2 and breath sounds checked- equal and bilateral Secured at: 21 cm Tube secured with: Tape Dental Injury: Teeth and Oropharynx as per pre-operative assessment

## 2016-08-29 DIAGNOSIS — Z79899 Other long term (current) drug therapy: Secondary | ICD-10-CM | POA: Diagnosis not present

## 2016-08-29 DIAGNOSIS — D34 Benign neoplasm of thyroid gland: Secondary | ICD-10-CM | POA: Diagnosis not present

## 2016-08-29 DIAGNOSIS — Z833 Family history of diabetes mellitus: Secondary | ICD-10-CM | POA: Diagnosis not present

## 2016-08-29 DIAGNOSIS — C73 Malignant neoplasm of thyroid gland: Secondary | ICD-10-CM | POA: Diagnosis not present

## 2016-08-29 DIAGNOSIS — Z803 Family history of malignant neoplasm of breast: Secondary | ICD-10-CM | POA: Diagnosis not present

## 2016-08-29 DIAGNOSIS — Z8249 Family history of ischemic heart disease and other diseases of the circulatory system: Secondary | ICD-10-CM | POA: Diagnosis not present

## 2016-08-29 DIAGNOSIS — Z808 Family history of malignant neoplasm of other organs or systems: Secondary | ICD-10-CM | POA: Diagnosis not present

## 2016-08-29 DIAGNOSIS — Z809 Family history of malignant neoplasm, unspecified: Secondary | ICD-10-CM | POA: Diagnosis not present

## 2016-08-29 MED ORDER — HYDROCODONE-ACETAMINOPHEN 5-325 MG PO TABS: 1.0000 | ORAL_TABLET | ORAL | 0 refills | Status: DC | PRN

## 2016-08-29 NOTE — Discharge Summary (Signed)
Physician Discharge Summary [facility]  Patient ID: Diane Russell MRN: [id number] DOB/AGE: 11-22-1975 41 y.o.  Admit date: 08/28/2016 Discharge date: 08/29/2016  Admission Diagnoses:  Thyroid neoplasm of uncertain behavior  Discharge Diagnoses:  Principal Problem:   Neoplasm of uncertain behavior of thyroid gland   Discharged Condition: good  Hospital Course: Patient was admitted for observation following thyroid surgery.  Post op course was uncomplicated.  Pain was well controlled.  Tolerated diet.  Patient was prepared for discharge home on POD#1.  Consults: None  Treatments: surgery: right thyroid lobectomy and isthmusectomy  Discharge Exam: Blood pressure 118/69, pulse 79, temperature 97.7 F (36.5 C), temperature source Oral, resp. rate 16, height 5\' 5"  (1.651 m), weight 73.9 kg (163 lb), SpO2 100 %. HEENT - clear Neck - wound dry and intact; minimal STS; voice normal  Disposition: Home  Discharge Instructions    Apply dressing    Complete by:  As directed    Diet - low sodium heart healthy    Complete by:  As directed    Discharge instructions    Complete by:  As directed    [facility]  THYROID & PARATHYROID SURGERY:  POST-OP INSTRUCTIONS  Always review your discharge instruction sheet from the facility where your surgery was performed.  A prescription for pain medication may be given to you upon discharge.  Take your pain medication as prescribed.  If narcotic pain medicine is not needed, then you may take acetaminophen (Tylenol) or ibuprofen (Advil) as needed.  Take your usually prescribed medications unless otherwise directed.  If you need a refill on your pain medication, please contact our office during regular business hours.  Prescriptions will not be processed by our office after 5 pm or on weekends.  Start with a light diet upon arrival home, such as soup and crackers or toast.  Be sure to drink plenty of  fluids daily.  Resume your normal diet the day after surgery.  Most patients will experience some swelling and bruising on the chest and neck area.  Ice packs will help.  Swelling and bruising can take several days to resolve.   It is common to experience some constipation after surgery.  Increasing fluid intake and taking a stool softener (Colace) will usually help or prevent this problem.  A mild laxative (Milk of Magnesia or Miralax) should be taken according to package directions if there has been no bowel movement after 48 hours.  You have steri-strips and a gauze dressing over your incision.  You may remove the gauze bandage on the second day after surgery, and you may shower at that time.  Leave your steri-strips (small skin tapes) in place directly over the incision.  These strips should remain on the skin for 5-7 days and then be removed.  You may get them wet in the shower and pat them dry.  You may resume regular (light) daily activities beginning the next day - such as daily self-care, walking, climbing stairs - gradually increasing activities as tolerated.  You may have sexual intercourse when it is comfortable.  Refrain from any heavy lifting or straining until approved by your doctor.  You may drive when you no longer are taking prescription pain medication, you can comfortably wear a seatbelt, and you can safely maneuver your car and apply brakes.  You should see your doctor in the office for a follow-up appointment approximately three weeks after your surgery.  Make sure that you call  for this appointment within a day or two after you arrive home to insure a convenient appointment time.  WHEN TO CALL YOUR DOCTOR: -- Fever greater than 101.5 -- Inability to urinate -- Nausea and/or vomiting - persistent -- Extreme swelling or bruising -- Continued bleeding from incision -- Increased pain, redness, or drainage from the incision -- Difficulty swallowing or breathing -- Muscle  cramping or spasms -- Numbness or tingling in hands or around lips  The clinic staff is available to answer your questions during regular business hours.  Please don't hesitate to call and ask to speak to one of the nurses if you have concerns.  Earnstine Regal, MD, [facility] Office: [phone]  Website: [email or web address]   Increase activity slowly    Complete by:  As directed    Remove dressing in 24 hours    Complete by:  As directed      Allergies as of 08/29/2016   No Known Allergies     Medication List    TAKE these medications   ALEVE-D SINUS & COLD 120-220 MG Tb12 Generic drug:  Pseudoephedrine-Naproxen Na Take 1 tablet by mouth daily as needed (for congestion).   fluticasone 50 MCG/ACT nasal spray Commonly known as:  FLONASE Place 1 spray into both nostrils daily.   HYDROcodone-acetaminophen 5-325 MG tablet Commonly known as:  NORCO/VICODIN Take 1-2 tablets by mouth every 4 (four) hours as needed for moderate pain.   MAGNESIUM CITRATE PO Take 150 mg by mouth at bedtime. Two pills at bedtime   magnesium gluconate 500 MG tablet Commonly known as:  MAGONATE Take 1,000 mg by mouth at bedtime.   MIRENA (52 MG) 20 MCG/24HR IUD Generic drug:  levonorgestrel 1 each by Intrauterine route as directed. Every 5 years   oxymetazoline 0.05 % nasal spray Commonly known as:  AFRIN Place 1 spray into both nostrils at bedtime as needed for congestion.   REWETTING DROPS Soln Apply 1 drop to eye daily as needed (for dry eyes/contacts).      Follow-up Information    Maricela Schreur M, MD. Schedule an appointment as soon as possible for a visit in 3 week(s).   Specialty:  General Surgery Why:  for wound check Contact information: [place] [address] [phone]           Earnstine Regal, MD, [facility] Office:  [phone]   Signed: Earnstine Regal 08/29/2016, 8:03 AM

## 2016-08-29 NOTE — Progress Notes (Signed)
Patient was discharged to home. Vital signs stable and tolerating diet, ambulating in hallway.  Instructions given and understanding was verbalized.  Prescription was given.

## 2016-08-31 DIAGNOSIS — J069 Acute upper respiratory infection, unspecified: Secondary | ICD-10-CM | POA: Diagnosis not present

## 2016-09-20 ENCOUNTER — Other Ambulatory Visit: Payer: Self-pay | Admitting: Internal Medicine

## 2016-09-20 DIAGNOSIS — C73 Malignant neoplasm of thyroid gland: Secondary | ICD-10-CM | POA: Diagnosis not present

## 2016-09-20 DIAGNOSIS — Z808 Family history of malignant neoplasm of other organs or systems: Secondary | ICD-10-CM | POA: Diagnosis not present

## 2016-10-10 DIAGNOSIS — Z9889 Other specified postprocedural states: Secondary | ICD-10-CM | POA: Diagnosis not present

## 2016-10-10 DIAGNOSIS — C73 Malignant neoplasm of thyroid gland: Secondary | ICD-10-CM | POA: Diagnosis not present

## 2016-10-13 DIAGNOSIS — C73 Malignant neoplasm of thyroid gland: Secondary | ICD-10-CM | POA: Diagnosis not present

## 2016-10-22 ENCOUNTER — Other Ambulatory Visit: Payer: BLUE CROSS/BLUE SHIELD

## 2016-11-06 DIAGNOSIS — Z9889 Other specified postprocedural states: Secondary | ICD-10-CM | POA: Diagnosis not present

## 2016-11-06 DIAGNOSIS — C73 Malignant neoplasm of thyroid gland: Secondary | ICD-10-CM | POA: Diagnosis not present

## 2016-11-06 DIAGNOSIS — E041 Nontoxic single thyroid nodule: Secondary | ICD-10-CM | POA: Diagnosis not present

## 2016-11-06 DIAGNOSIS — E890 Postprocedural hypothyroidism: Secondary | ICD-10-CM | POA: Diagnosis not present

## 2016-12-12 DIAGNOSIS — Z6827 Body mass index (BMI) 27.0-27.9, adult: Secondary | ICD-10-CM | POA: Diagnosis not present

## 2016-12-12 DIAGNOSIS — Z01419 Encounter for gynecological examination (general) (routine) without abnormal findings: Secondary | ICD-10-CM | POA: Diagnosis not present

## 2016-12-12 DIAGNOSIS — Z1231 Encounter for screening mammogram for malignant neoplasm of breast: Secondary | ICD-10-CM | POA: Diagnosis not present

## 2016-12-12 DIAGNOSIS — Z85850 Personal history of malignant neoplasm of thyroid: Secondary | ICD-10-CM | POA: Diagnosis not present

## 2017-03-25 DIAGNOSIS — C73 Malignant neoplasm of thyroid gland: Secondary | ICD-10-CM | POA: Diagnosis not present

## 2017-05-22 DIAGNOSIS — C73 Malignant neoplasm of thyroid gland: Secondary | ICD-10-CM | POA: Diagnosis not present

## 2017-08-27 DIAGNOSIS — D225 Melanocytic nevi of trunk: Secondary | ICD-10-CM | POA: Diagnosis not present

## 2017-08-27 DIAGNOSIS — D2271 Melanocytic nevi of right lower limb, including hip: Secondary | ICD-10-CM | POA: Diagnosis not present

## 2017-08-27 DIAGNOSIS — D485 Neoplasm of uncertain behavior of skin: Secondary | ICD-10-CM | POA: Diagnosis not present

## 2017-08-27 DIAGNOSIS — D224 Melanocytic nevi of scalp and neck: Secondary | ICD-10-CM | POA: Diagnosis not present

## 2017-08-27 DIAGNOSIS — D2261 Melanocytic nevi of right upper limb, including shoulder: Secondary | ICD-10-CM | POA: Diagnosis not present

## 2017-11-15 ENCOUNTER — Other Ambulatory Visit: Payer: Self-pay | Admitting: Internal Medicine

## 2017-11-15 DIAGNOSIS — C73 Malignant neoplasm of thyroid gland: Secondary | ICD-10-CM

## 2017-11-19 ENCOUNTER — Ambulatory Visit
Admission: RE | Admit: 2017-11-19 | Discharge: 2017-11-19 | Disposition: A | Discharge status: Home / Self Care | Payer: BLUE CROSS/BLUE SHIELD | Source: Ambulatory Visit | Attending: Internal Medicine | Admitting: Internal Medicine

## 2017-11-19 DIAGNOSIS — C73 Malignant neoplasm of thyroid gland: Secondary | ICD-10-CM

## 2017-11-19 DIAGNOSIS — E041 Nontoxic single thyroid nodule: Secondary | ICD-10-CM | POA: Diagnosis not present

## 2017-11-21 DIAGNOSIS — C73 Malignant neoplasm of thyroid gland: Secondary | ICD-10-CM | POA: Diagnosis not present

## 2017-11-21 DIAGNOSIS — E890 Postprocedural hypothyroidism: Secondary | ICD-10-CM | POA: Diagnosis not present

## 2017-11-29 DIAGNOSIS — J0100 Acute maxillary sinusitis, unspecified: Secondary | ICD-10-CM | POA: Diagnosis not present

## 2018-02-11 DIAGNOSIS — Z85850 Personal history of malignant neoplasm of thyroid: Secondary | ICD-10-CM | POA: Diagnosis not present

## 2018-03-12 DIAGNOSIS — E039 Hypothyroidism, unspecified: Secondary | ICD-10-CM | POA: Diagnosis not present

## 2018-03-12 DIAGNOSIS — Z6827 Body mass index (BMI) 27.0-27.9, adult: Secondary | ICD-10-CM | POA: Diagnosis not present

## 2018-03-12 DIAGNOSIS — Z30431 Encounter for routine checking of intrauterine contraceptive device: Secondary | ICD-10-CM | POA: Diagnosis not present

## 2018-03-12 DIAGNOSIS — Z1231 Encounter for screening mammogram for malignant neoplasm of breast: Secondary | ICD-10-CM | POA: Diagnosis not present

## 2018-03-12 DIAGNOSIS — Z124 Encounter for screening for malignant neoplasm of cervix: Secondary | ICD-10-CM | POA: Diagnosis not present

## 2018-03-12 DIAGNOSIS — Z01419 Encounter for gynecological examination (general) (routine) without abnormal findings: Secondary | ICD-10-CM | POA: Diagnosis not present

## 2018-03-13 ENCOUNTER — Other Ambulatory Visit: Payer: Self-pay | Admitting: Obstetrics and Gynecology

## 2018-03-13 DIAGNOSIS — R928 Other abnormal and inconclusive findings on diagnostic imaging of breast: Secondary | ICD-10-CM

## 2018-03-18 ENCOUNTER — Ambulatory Visit
Admission: RE | Admit: 2018-03-18 | Discharge: 2018-03-18 | Disposition: A | Discharge status: Home / Self Care | Payer: BLUE CROSS/BLUE SHIELD | Source: Ambulatory Visit | Attending: Obstetrics and Gynecology | Admitting: Obstetrics and Gynecology

## 2018-03-18 DIAGNOSIS — R928 Other abnormal and inconclusive findings on diagnostic imaging of breast: Secondary | ICD-10-CM

## 2018-03-18 DIAGNOSIS — N6489 Other specified disorders of breast: Secondary | ICD-10-CM | POA: Diagnosis not present

## 2018-07-19 DIAGNOSIS — J069 Acute upper respiratory infection, unspecified: Secondary | ICD-10-CM | POA: Diagnosis not present

## 2019-05-16 IMAGING — MG DIGITAL DIAGNOSTIC UNILATERAL RIGHT MAMMOGRAM WITH TOMO AND CAD
6 series · 6 of 18 positions shown · non-contrast
Comparison: Previous exam(s).

CLINICAL DATA: The patient was called back for a right breast
asymmetry.

EXAM:
DIGITAL DIAGNOSTIC RIGHT MAMMOGRAM WITH CAD AND TOMO
ULTRASOUND RIGHT BREAST

[R ML synth-2D]
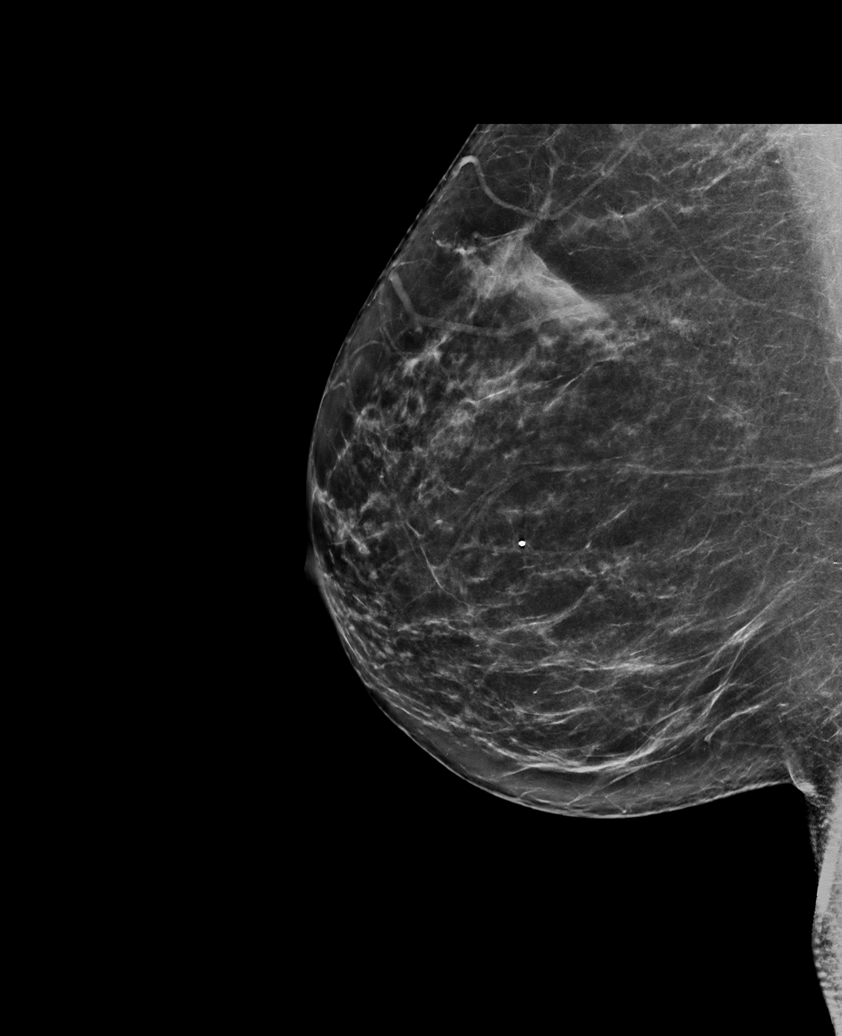

[R CC synth-2D (1 of 2)]
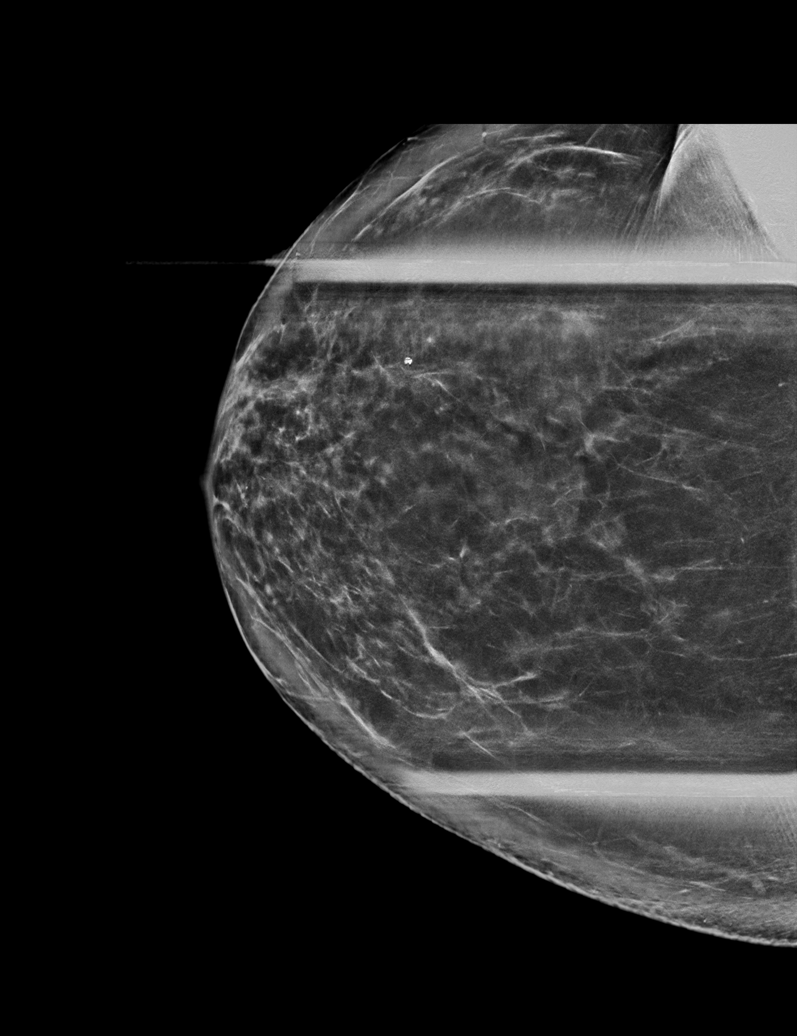

[R CC synth-2D (2 of 2)]
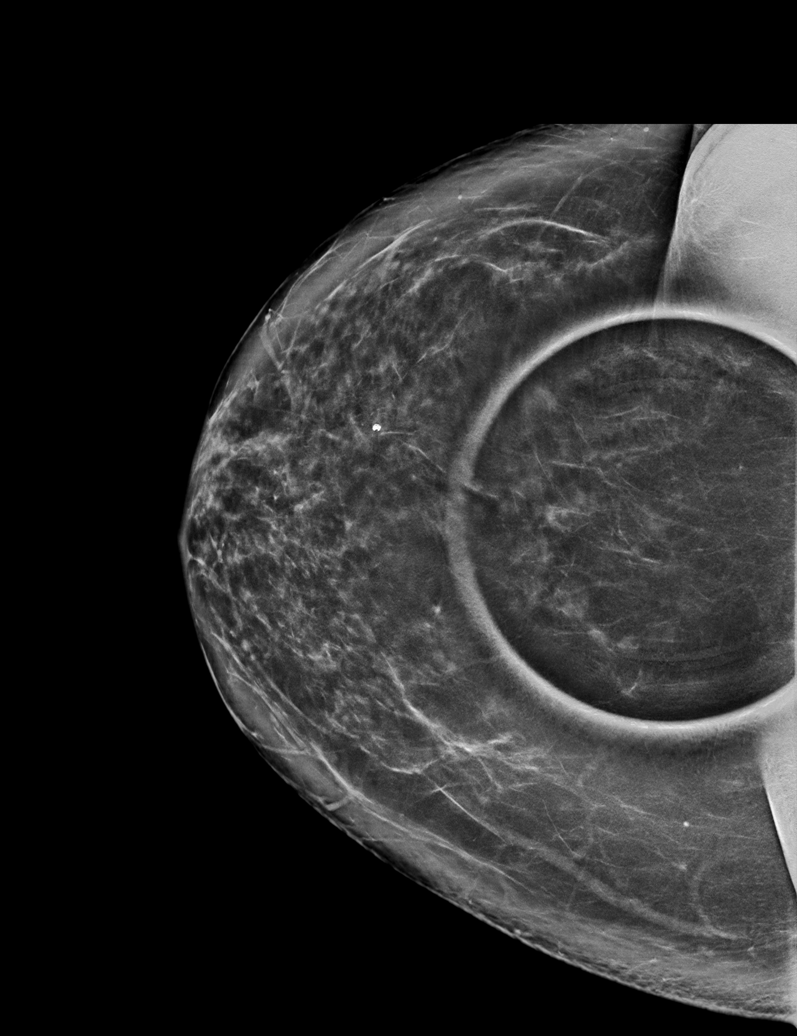

[R ML tomo · tomo slice 41/81.0]
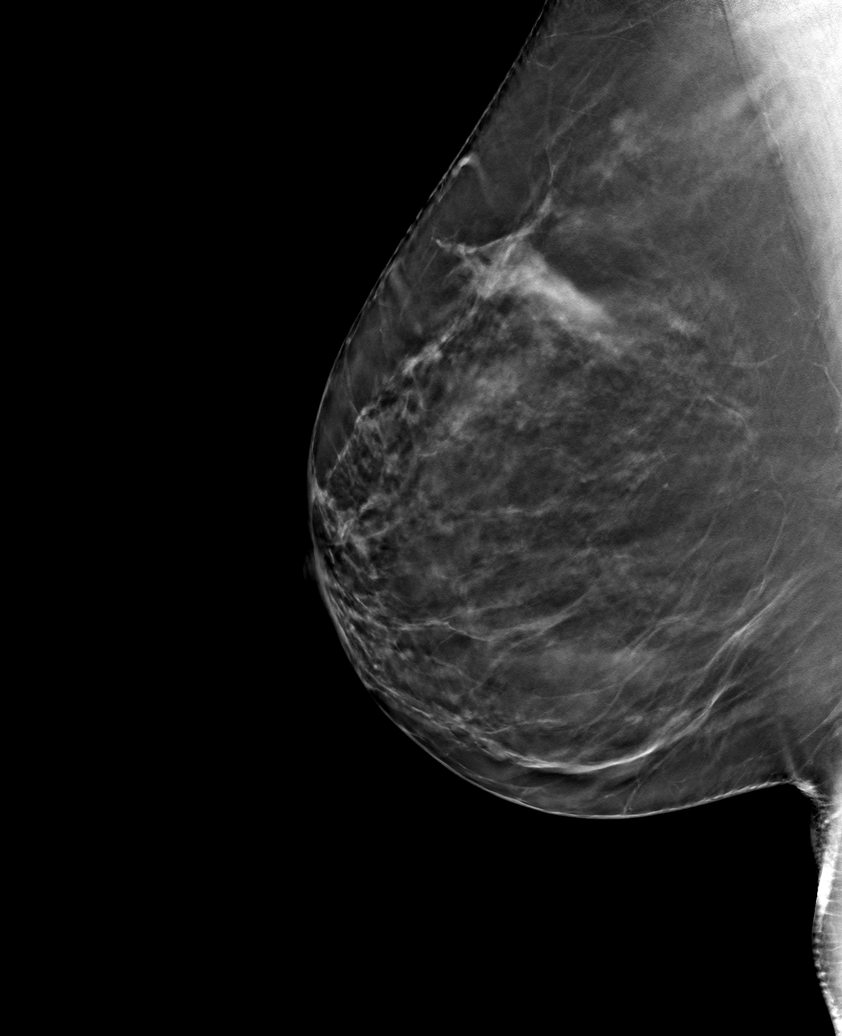

[R CC tomo (1 of 2) · tomo slice 47/93.0]
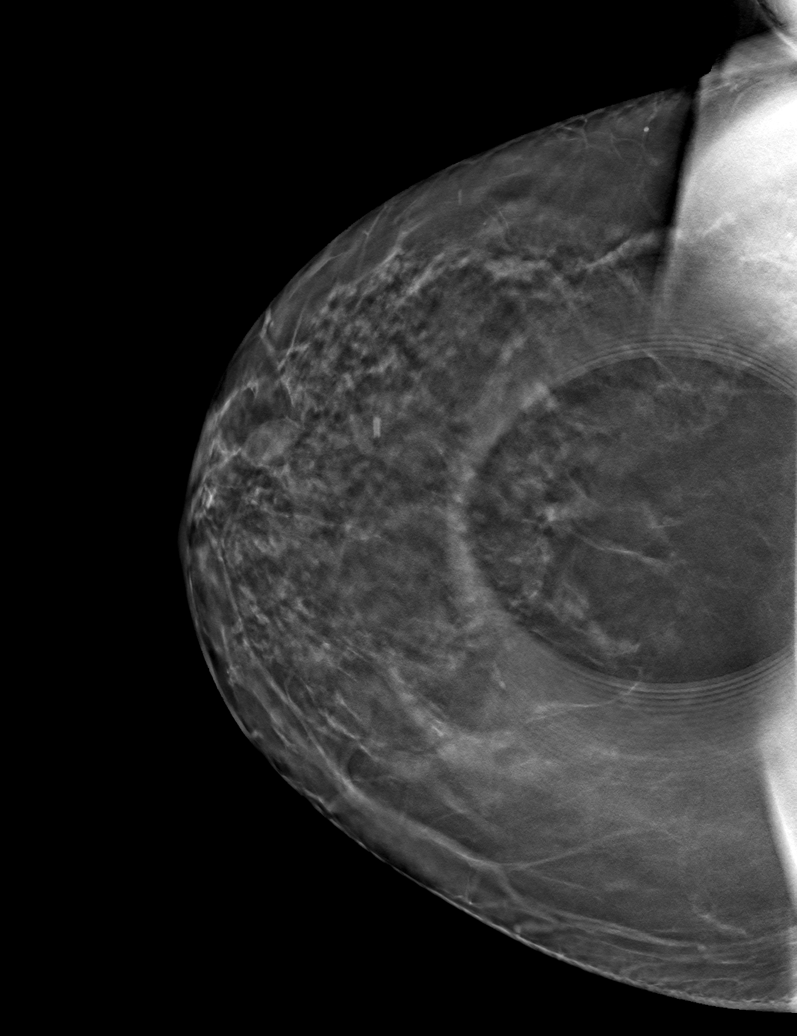

[R CC tomo (2 of 2) · tomo slice 47/93.0]
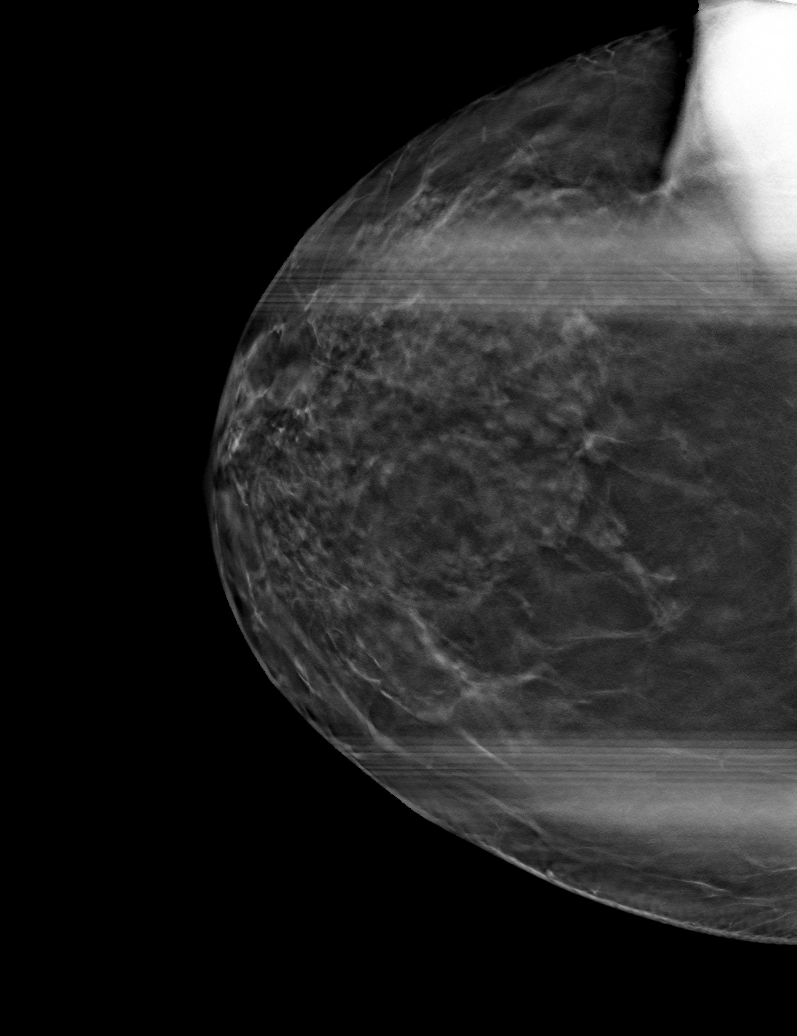

[6 of 18 positions shown; findings below may reference images not displayed]

ACR Breast Density Category b: There are scattered areas of
fibroglandular density.
FINDINGS: The asymmetry resolves on additional imaging. Today's imaging is
unchanged in appearance since September 2014.

Mammographic images were processed with CAD.

On physical exam, no suspicious lumps are identified.

Targeted ultrasound is performed, showing no abnormalities in the
medial right breast.
IMPRESSION: No mammographic or sonographic evidence of malignancy.

RECOMMENDATION:
Annual screening mammography.

I have discussed the findings and recommendations with the patient.
Results were also provided in writing at the conclusion of the
visit. If applicable, a reminder letter will be sent to the patient
regarding the next appointment.

BI-RADS CATEGORY  2: Benign.

## 2019-06-15 ENCOUNTER — Other Ambulatory Visit: Payer: Self-pay

## 2019-06-15 DIAGNOSIS — Z20822 Contact with and (suspected) exposure to covid-19: Secondary | ICD-10-CM

## 2019-06-16 LAB — NOVEL CORONAVIRUS, NAA: SARS-CoV-2, NAA: DETECTED — AB

## 2023-07-23 HISTORY — PX: COLONOSCOPY WITH PROPOFOL: SHX5780

## 2023-12-20 ENCOUNTER — Other Ambulatory Visit: Payer: Self-pay | Admitting: Obstetrics and Gynecology

## 2023-12-20 DIAGNOSIS — Z01818 Encounter for other preprocedural examination: Secondary | ICD-10-CM

## 2024-02-19 ENCOUNTER — Other Ambulatory Visit: Payer: Self-pay | Admitting: Obstetrics and Gynecology

## 2024-02-19 DIAGNOSIS — D252 Subserosal leiomyoma of uterus: Secondary | ICD-10-CM

## 2024-03-12 ENCOUNTER — Encounter (HOSPITAL_COMMUNITY): Payer: Self-pay | Admitting: Obstetrics and Gynecology

## 2024-03-12 NOTE — Pre-Procedure Instructions (Signed)
 Surgical Instructions   Your procedure is scheduled on :  Thursday,  03-19-2024. Report to [facility] Main Entrance A at 7:15  A.M., then check in with the Admitting office. Any questions or running late day of surgery: call [phone]  Questions prior to your surgery date: call [phone], Monday-Friday, 8am-4pm. If you experience any cold or flu symptoms such as cough, fever, chills, shortness of breath, etc. between now and your scheduled surgery, please notify your surgeon office.    Remember:  Do not eat any food and do not drink any liquids after midnight the night before your surgery.  This includes no water,  candy,  gum,  and  mints.    Take these medicines the morning of surgery with A SIPS OF WATER : Levothyroxine (synthroid) Liothyronine (cytomel)   May take these medicines IF NEEDED: Fluticasone (flonase) nasal spray   One week prior to surgery, STOP taking any Aspirin (unless otherwise instructed by your surgeon) Aleve, Naproxen, Ibuprofen, Motrin, Advil, Goody's, BC's, all herbal medications, fish oil, and non-prescription vitamins.                     Do NOT Smoke (Tobacco/Vaping) and Do Not drink alcohol for 24 hours prior to your procedure.  If you use a CPAP at night, you may bring your mask/headgear for your overnight stay.   You will be asked to remove any contacts, glasses, piercing's, hearing aid's, dentures/partials prior to surgery. Please bring cases/ contact solution/ etc.,  for these items day of surgery.   Patients discharged the day of surgery will not be allowed to drive home, and someone needs to stay with them for 24 hours.  SURGICAL WAITING ROOM VISITATION Patients may have no more than 2 support people in the waiting area - these visitors may rotate.   Pre-op nurse will coordinate an appropriate time for 1 ADULT support person, who may not rotate, to accompany patient in pre-op.  Children under the age of 41 must have an adult with them  who is not the patient and must remain in the main waiting area with an adult.  If the patient needs to stay at the hospital during part of their recovery, the visitor guidelines for inpatient rooms apply.  Please refer to the [facility] website for the visitor guidelines for any additional information.   If you received a COVID test during your pre-op visit  it is requested that you wear a mask when out in public, stay away from anyone that may not be feeling well and notify your surgeon if you develop symptoms. If you have been in contact with anyone that has tested positive in the last 10 days please notify you surgeon.      Pre-operative CHG Bathing Instructions   You can play a key role in reducing the risk of infection after surgery. Your skin needs to be as free of germs as possible. You can reduce the number of germs on your skin by washing with CHG (chlorhexidine  gluconate) soap before surgery. CHG is an antiseptic soap that kills germs and continues to kill germs even after washing.   DO NOT use if you have an allergy to chlorhexidine /CHG or antibacterial soaps. If your skin becomes reddened or irritated, stop using the CHG and notify one of our RNs in Pre-op.             TAKE A SHOWER THE NIGHT BEFORE SURGERY AND THE DAY OF SURGERY  Please keep in mind the following:  DO NOT shave, including legs and underarms, 48 hours prior to surgery.   You may shave your face before/day of surgery.  Place clean sheets on your bed the night before surgery Use a clean washcloth (not used since being washed) for each shower. DO NOT sleep with pet's night before surgery.  CHG Shower Instructions:  Wash your face and private area with normal soap. If you choose to wash your hair, wash first with your normal shampoo.  After you use shampoo/soap, rinse your hair and body thoroughly to remove shampoo/soap residue.  Turn the water OFF and apply half the bottle of CHG soap to a CLEAN washcloth.   Apply CHG soap ONLY FROM YOUR NECK DOWN TO YOUR TOES (washing for 3-5 minutes)  DO NOT use CHG soap on face, private areas, open wounds, or sores.  Pay special attention to the area where your surgery is being performed.  If you are having back surgery, having someone wash your back for you may be helpful. Wait 2 minutes after CHG soap is applied, then you may rinse off the CHG soap.  Pat dry with a clean towel  Put on clean pajamas    Additional instructions for the day of surgery: DO NOT APPLY any lotions,  powder,  oils,  deodorants (may use underarm deodorant) , cologne/ perfumes  or makeup Do not wear jewelry/  piercing's/ metal/  permanent jewelry must be removed prior to arrival day of surgery.  (No plastic piercing) Do not wear nail polish, gel polish, artificial nails, or any other type of covering on natural finger nails (toe nails are okay) Do not bring valuables to the hospital. [facility] is not responsible for valuables/personal belongings. Put on clean/comfortable clothes.  Please brush your teeth.  Ask your nurse before applying any prescription medications to the skin.

## 2024-03-12 NOTE — Progress Notes (Signed)
 Spoke w/ via phone for pre-op interview--- pt Lab needs dos----   upt      Lab results------ lab appt 03-18-2024 @ 0900 getting CBC/ T&S COVID test -----patient states asymptomatic no test needed Arrive at ------- 0715 on 03-19-2024 NPO after MN w/ exception sips of water w/ meds Pre-Surgery Ensure or G2: n/  Med rec completed Medications to take morning of surgery ----- synthroid, cytomel Diabetic medication ----- n/a  GLP1 agonist last dose: n/a GLP1 instructions:  Patient instructed no nail polish to be worn day of surgery Patient instructed to bring photo id and insurance card day of surgery Patient aware to have Driver (ride ) / caregiver    for 24 hours after surgery - husband, Diane Russell Patient Special Instructions ----- will pick up soap and written instructions at lab appt Pre-Op special Instructions ----- n/a  Patient verbalized understanding of instructions that were given at this phone interview. Patient denies chest pain, sob, fever, cough at the interview.

## 2024-03-18 ENCOUNTER — Encounter (HOSPITAL_COMMUNITY)
Admission: RE | Admit: 2024-03-18 | Discharge: 2024-03-18 | Disposition: A | Discharge status: Home / Self Care | Payer: PRIVATE HEALTH INSURANCE | Source: Ambulatory Visit | Attending: Obstetrics and Gynecology | Admitting: Obstetrics and Gynecology

## 2024-03-18 DIAGNOSIS — Z01818 Encounter for other preprocedural examination: Secondary | ICD-10-CM

## 2024-03-18 DIAGNOSIS — Z01812 Encounter for preprocedural laboratory examination: Secondary | ICD-10-CM | POA: Insufficient documentation

## 2024-03-18 LAB — CBC
HCT: 46.5 % — ABNORMAL HIGH (ref 36.0–46.0)
Hemoglobin: 15.1 g/dL — ABNORMAL HIGH (ref 12.0–15.0)
MCH: 28.3 pg (ref 26.0–34.0)
MCHC: 32.5 g/dL (ref 30.0–36.0)
MCV: 87.1 fL (ref 80.0–100.0)
Platelets: 312 K/uL (ref 150–400)
RBC: 5.34 MIL/uL — ABNORMAL HIGH (ref 3.87–5.11)
RDW: 12.6 % (ref 11.5–15.5)
WBC: 9.6 K/uL (ref 4.0–10.5)
nRBC: 0 % (ref 0.0–0.2)

## 2024-03-18 LAB — TYPE AND SCREEN
ABO/RH(D): B POS
Antibody Screen: NEGATIVE

## 2024-03-18 NOTE — H&P (Signed)
 48 y.o. G2P2 with quickly enlarging uterus with a single fibroid.    Pt doing well on Mirena with no complaints or IM. Past due for replace and remove IUD. Having intermittent spotting now. Since Nov 2025 having some pressure and bloating, colo was normal. Looks like she is pregnant and not like just squishy.  US  in 2022 was Physician interpretation: 8x6x5, EM 6mm. IUD in place. RO with simple cyst 3.6 cm. LO normal. Fibroid at LUS 4.7 cm. No ff. /mah  US  at [facility] was Physician interpretation: 17x10x13. 12 cm fibroids posterior right. Ovaries generally normal. EM 5mm with IUD in place. /mahmd.  US  2 months later was 22x16x11; IUD in EM. Fibroid 17x13. Cervix 1.9 cm.  No ff.   MRI: Reproductive: The uterus is enlarged by a solitary, extremely large enhancing fibroid arising from the posterior uterine fundus measuring 22.8 x 11.8 x 21.5 cm (series 6, image 60, series 3, image 24). This extends well into the abdomen and effaces the endometrial cavity. IUD present in the fundal cavity (series 17, image 47). Ovaries displaced and difficult to clearly visualize but without obvious abnormality.  Past Medical History:  Diagnosis Date   Hypothyroidism, postsurgical 2018   endocrinology--- dr loise. ary   Migraines    Primary malignant neoplasm of thyroid  [facility]) 08/2016   [facility] in [place]-- Dr GEANNIE ary;   07-18-2016 FNA bx right thyroid  nodule bethesda V;  08-28-2016 s/p right thyroid  lobecotmy;  Stage I,  no further treatment;   monitored yearly;  (lov in epic 05-09-2023 w/ US  ,  stable left nodules,  right side no evidence recurrence and no residual tissue thyroid  bed post resection   Seasonal allergies    Uterine fibroid    symptomatic   Wears contact lenses    Past Surgical History:  Procedure Laterality Date   COLONOSCOPY WITH PROPOFOL   07/23/2023   @ [facility]   THYROID  LOBECTOMY Right 08/28/2016   Procedure: RIGHT THYROID  LOBECTOMY;  Surgeon: Krystal Spinner, MD;   Location: [facility];  Service: General;  Laterality: Right;   WISDOM TOOTH EXTRACTION      Social History   Socioeconomic History   Marital status: Married    Spouse name: Not on file   Number of children: Not on file   Years of education: Not on file   Highest education level: Not on file  Occupational History   Not on file  Tobacco Use   Smoking status: Never   Smokeless tobacco: Never  Vaping Use   Vaping status: Never Used  Substance and Sexual Activity   Alcohol use: No   Drug use: Never   Sexual activity: Not on file  Other Topics Concern   Not on file  Social History Narrative   Not on file   Social Drivers of Health   Financial Resource Strain: Low Risk  (05/06/2023)   Received from [facility]   Overall Financial Resource Strain (CARDIA)    Difficulty of Paying Living Expenses: Not hard at all  Food Insecurity: No Food Insecurity (05/06/2023)   Received from [facility]   Hunger Vital Sign    Within the past 12 months, you worried that your food would run out before you got the money to buy more.: Never true    Within the past 12 months, the food you bought just didn't last and you didn't have money to get more.: Never true  Transportation Needs: No Transportation Needs (05/06/2023)  Received from [facility] - Transportation    In the past 12 months, has lack of transportation kept you from medical appointments or from getting medications?: No    Lack of Transportation (Non-Medical): No  Physical Activity: Insufficiently Active (03/29/2021)   Received from [facility]   Exercise Vital Sign    On average, how many days per week do you engage in moderate to strenuous exercise (like a brisk walk)?: 2 days    On average, how many minutes do you engage in exercise at this level?: 30 min  Stress: Stress Concern Present (03/29/2021)   Received from [facility] - Occupational Stress Questionnaire    Feeling of Stress : To some extent  Social Connections: Not on file  Intimate Partner Violence: Not on file    No current facility-administered medications on file prior to encounter.   Current Outpatient Medications on File Prior to Encounter  Medication Sig Dispense Refill   aspirin-acetaminophen -caffeine (EXCEDRIN MIGRAINE) 250-250-65 MG tablet Take 1-2 tablets by mouth every 6 (six) hours as needed for headache.     fluticasone  (FLONASE ) 50 MCG/ACT nasal spray Place 1 spray into both nostrils daily as needed for allergies.     levonorgestrel (MIRENA, 52 MG,) 20 MCG/24HR IUD 1 each by Intrauterine route as directed.     levothyroxine  (SYNTHROID ) 50 MCG tablet Take 50 mcg by mouth as directed. Takes one tablet 5 days a week  and takes two tablets two days a week  every morning     liothyronine  (CYTOMEL ) 5 MCG tablet Take 5 mcg by mouth daily.     Magnesium  Glycinate 120 MG CAPS Take 2-3 capsules by mouth at bedtime.     Pseudoephedrine-Naproxen Na (ALEVE-D SINUS & COLD) 120-220 MG TB12 Take 1 tablet by mouth daily as needed (for congestion).      No Known Allergies  Vitals:   03/12/24 1556  Weight: 81.6 kg  Height: 5' 6 (1.676 m)    Lungs: clear to ascultation Cor:  RRR Abdomen:  soft, nontender, nondistended. Ex:  no cords, erythema Pelvic:   Vulva: no masses, no atrophy, no lesions Vagina: no tenderness, no erythema, no abnormal vaginal discharge, no vesicle(s) or ulcers, no cystocele, no rectocele Cervix: grossly normal, no discharge, no cervical motion tenderness Uterus: midline, no uterine prolapse, non-tender, enlarged (22 weeks size, strings not seen or felt), contour irregular, fibroids Bladder/Urethra: normal meatus, no urethral discharge, no urethral mass, bladder non distended, Urethra well supported Adnexa/Parametria: no parametrial tenderness, no adnexal tenderness, parametrial mass (large  ovary mobile on R)   A:  For TAH/salpingectomies secondary quickly enlarging uterus.  MRI does not mention any signs of cancer.  We discussed that the likelihood of cancer esp out side the uterus is very low and I will likely do all the appropriate procedures (BSO is not needed for sarcoma) with TAH/salpingectomies.    P: P: All risks, benefits and alternatives d/w patient and she desires to proceed.  Patient has undergone an ERAS protocol and will receive preop antibiotics and SCDs during the operation.   Pt to have extended recovery but will go home day postop 1 if eating, ambulating, voiding and pain control is good.  Diane Russell

## 2024-03-19 ENCOUNTER — Encounter (HOSPITAL_COMMUNITY): Payer: Self-pay | Admitting: Obstetrics and Gynecology

## 2024-03-19 ENCOUNTER — Encounter (HOSPITAL_COMMUNITY)
Admission: RE | Disposition: A | Discharge status: Home / Self Care | Payer: Self-pay | Source: Home / Self Care | Attending: Obstetrics and Gynecology

## 2024-03-19 ENCOUNTER — Ambulatory Visit (HOSPITAL_COMMUNITY): Payer: PRIVATE HEALTH INSURANCE | Admitting: Anesthesiology

## 2024-03-19 ENCOUNTER — Other Ambulatory Visit: Payer: Self-pay

## 2024-03-19 ENCOUNTER — Inpatient Hospital Stay (HOSPITAL_COMMUNITY)
Admission: RE | Admit: 2024-03-19 | Discharge: 2024-03-20 | DRG: 743 | Disposition: A | Discharge status: Home / Self Care | Payer: PRIVATE HEALTH INSURANCE | Attending: Obstetrics and Gynecology | Admitting: Obstetrics and Gynecology

## 2024-03-19 DIAGNOSIS — Z9889 Other specified postprocedural states: Secondary | ICD-10-CM

## 2024-03-19 DIAGNOSIS — Z85850 Personal history of malignant neoplasm of thyroid: Secondary | ICD-10-CM

## 2024-03-19 DIAGNOSIS — E890 Postprocedural hypothyroidism: Secondary | ICD-10-CM | POA: Diagnosis present

## 2024-03-19 DIAGNOSIS — Z79890 Hormone replacement therapy (postmenopausal): Secondary | ICD-10-CM | POA: Diagnosis not present

## 2024-03-19 DIAGNOSIS — D259 Leiomyoma of uterus, unspecified: Principal | ICD-10-CM | POA: Diagnosis present

## 2024-03-19 DIAGNOSIS — Z01818 Encounter for other preprocedural examination: Principal | ICD-10-CM

## 2024-03-19 HISTORY — DX: Leiomyoma of uterus, unspecified: D25.9

## 2024-03-19 HISTORY — DX: Presence of spectacles and contact lenses: Z97.3

## 2024-03-19 HISTORY — DX: Other seasonal allergic rhinitis: J30.2

## 2024-03-19 HISTORY — PX: HYSTERECTOMY ABDOMINAL WITH SALPINGECTOMY: SHX6725

## 2024-03-19 HISTORY — PX: CYSTOSCOPY: SHX5120

## 2024-03-19 LAB — POCT PREGNANCY, URINE: Preg Test, Ur: NEGATIVE

## 2024-03-19 LAB — CBC
HCT: 34.8 % — ABNORMAL LOW (ref 36.0–46.0)
Hemoglobin: 11.5 g/dL — ABNORMAL LOW (ref 12.0–15.0)
MCH: 28.5 pg (ref 26.0–34.0)
MCHC: 33 g/dL (ref 30.0–36.0)
MCV: 86.1 fL (ref 80.0–100.0)
Platelets: 289 K/uL (ref 150–400)
RBC: 4.04 MIL/uL (ref 3.87–5.11)
RDW: 12.4 % (ref 11.5–15.5)
WBC: 12.1 K/uL — ABNORMAL HIGH (ref 4.0–10.5)
nRBC: 0 % (ref 0.0–0.2)

## 2024-03-19 LAB — ABO/RH: ABO/RH(D): B POS

## 2024-03-19 SURGERY — HYSTERECTOMY, TOTAL, ABDOMINAL, WITH SALPINGECTOMY
Anesthesia: General

## 2024-03-19 MED ORDER — ONDANSETRON HCL 4 MG PO TABS: 4.0000 mg | ORAL_TABLET | Freq: Four times a day (QID) | ORAL | Status: DC | PRN

## 2024-03-19 MED ORDER — AMISULPRIDE (ANTIEMETIC) 5 MG/2ML IV SOLN: 10.0000 mg | Freq: Once | INTRAVENOUS | Status: DC | PRN

## 2024-03-19 MED ORDER — FENTANYL CITRATE (PF) 100 MCG/2ML IJ SOLN
INTRAMUSCULAR | Status: AC
  Filled 2024-03-19: qty 2

## 2024-03-19 MED ORDER — CHLORHEXIDINE GLUCONATE 0.12 % MT SOLN
OROMUCOSAL | Status: AC
  Filled 2024-03-19: qty 15

## 2024-03-19 MED ORDER — OXYCODONE HCL 5 MG/5ML PO SOLN: 5.0000 mg | Freq: Once | ORAL | Status: DC | PRN

## 2024-03-19 MED ORDER — FLUTICASONE PROPIONATE 50 MCG/ACT NA SUSP: 1.0000 | Freq: Every day | NASAL | Status: DC | PRN

## 2024-03-19 MED ORDER — FLUORESCEIN SODIUM 10 % IV SOLN
INTRAVENOUS | Status: AC
Start: 2024-03-19 — End: 2024-03-19
  Filled 2024-03-19: qty 5

## 2024-03-19 MED ORDER — KETAMINE HCL 50 MG/5ML IJ SOSY
PREFILLED_SYRINGE | INTRAMUSCULAR | Status: AC
  Filled 2024-03-19: qty 5

## 2024-03-19 MED ORDER — ONDANSETRON HCL 4 MG/2ML IJ SOLN
INTRAMUSCULAR | Status: DC | PRN
  Administered 2024-03-19: 4 mg via INTRAVENOUS

## 2024-03-19 MED ORDER — GABAPENTIN 300 MG PO CAPS
300.0000 mg | ORAL_CAPSULE | ORAL | Status: AC
  Administered 2024-03-19: 300 mg via ORAL

## 2024-03-19 MED ORDER — MENTHOL 3 MG MT LOZG
1.0000 | LOZENGE | OROMUCOSAL | Status: DC | PRN
Start: 2024-03-19 — End: 2024-03-20

## 2024-03-19 MED ORDER — PROPOFOL 10 MG/ML IV BOLUS
INTRAVENOUS | Status: DC | PRN
  Administered 2024-03-19: 160 mg via INTRAVENOUS

## 2024-03-19 MED ORDER — CELECOXIB 200 MG PO CAPS
400.0000 mg | ORAL_CAPSULE | ORAL | Status: AC
  Administered 2024-03-19: 400 mg via ORAL

## 2024-03-19 MED ORDER — PANTOPRAZOLE SODIUM 40 MG PO TBEC
40.0000 mg | DELAYED_RELEASE_TABLET | Freq: Every day | ORAL | Status: DC
  Filled 2024-03-19: qty 1

## 2024-03-19 MED ORDER — FLUORESCEIN SODIUM 10 % IV SOLN
INTRAVENOUS | Status: DC | PRN
  Administered 2024-03-19: 10 mg via INTRAVENOUS

## 2024-03-19 MED ORDER — SODIUM CHLORIDE 0.9 % IV SOLN: 12.5000 mg | INTRAVENOUS | Status: DC | PRN

## 2024-03-19 MED ORDER — DOCUSATE SODIUM 100 MG PO CAPS
100.0000 mg | ORAL_CAPSULE | Freq: Two times a day (BID) | ORAL | Status: DC
  Administered 2024-03-19 – 2024-03-20 (×2): 100 mg via ORAL
  Filled 2024-03-19 (×2): qty 1

## 2024-03-19 MED ORDER — ACETAMINOPHEN 500 MG PO TABS
ORAL_TABLET | ORAL | Status: AC
  Filled 2024-03-19: qty 2

## 2024-03-19 MED ORDER — POVIDONE-IODINE 10 % EX SWAB: 2.0000 | Freq: Once | CUTANEOUS | Status: DC

## 2024-03-19 MED ORDER — CEFAZOLIN SODIUM-DEXTROSE 2-4 GM/100ML-% IV SOLN
2.0000 g | INTRAVENOUS | Status: AC
  Administered 2024-03-19: 2 g via INTRAVENOUS

## 2024-03-19 MED ORDER — LACTATED RINGERS IV SOLN: INTRAVENOUS | Status: DC

## 2024-03-19 MED ORDER — GABAPENTIN 300 MG PO CAPS
ORAL_CAPSULE | ORAL | Status: AC
  Filled 2024-03-19: qty 1

## 2024-03-19 MED ORDER — SOD CITRATE-CITRIC ACID 500-334 MG/5ML PO SOLN: 30.0000 mL | ORAL | Status: DC

## 2024-03-19 MED ORDER — METRONIDAZOLE 500 MG/100ML IV SOLN
500.0000 mg | Freq: Two times a day (BID) | INTRAVENOUS | Status: DC
  Administered 2024-03-19: 500 mg via INTRAVENOUS

## 2024-03-19 MED ORDER — VASOPRESSIN 20 UNIT/ML IV SOLN
INTRAVENOUS | Status: DC | PRN
  Administered 2024-03-19: 5 mL
  Administered 2024-03-19 (×2): 10 mL

## 2024-03-19 MED ORDER — ACETAMINOPHEN 500 MG PO TABS
1000.0000 mg | ORAL_TABLET | ORAL | Status: AC
  Administered 2024-03-19: 1000 mg via ORAL

## 2024-03-19 MED ORDER — ORAL CARE MOUTH RINSE: 15.0000 mL | Freq: Once | OROMUCOSAL | Status: AC

## 2024-03-19 MED ORDER — MIDAZOLAM HCL 2 MG/2ML IJ SOLN
INTRAMUSCULAR | Status: AC
  Filled 2024-03-19: qty 2

## 2024-03-19 MED ORDER — LIOTHYRONINE SODIUM 5 MCG PO TABS
5.0000 ug | ORAL_TABLET | Freq: Every day | ORAL | Status: DC
  Filled 2024-03-19: qty 1

## 2024-03-19 MED ORDER — CELECOXIB 200 MG PO CAPS
ORAL_CAPSULE | ORAL | Status: AC
Start: 2024-03-19 — End: 2024-03-19
  Filled 2024-03-19: qty 2

## 2024-03-19 MED ORDER — DEXAMETHASONE SODIUM PHOSPHATE 10 MG/ML IJ SOLN
INTRAMUSCULAR | Status: DC | PRN
  Administered 2024-03-19: 10 mg via INTRAVENOUS

## 2024-03-19 MED ORDER — OXYCODONE HCL 5 MG PO TABS: 5.0000 mg | ORAL_TABLET | Freq: Once | ORAL | Status: DC | PRN

## 2024-03-19 MED ORDER — PROPOFOL 10 MG/ML IV BOLUS
INTRAVENOUS | Status: AC
  Filled 2024-03-19: qty 20

## 2024-03-19 MED ORDER — HEMOSTATIC AGENTS (NO CHARGE) OPTIME
TOPICAL | Status: DC | PRN
Start: 2024-03-19 — End: 2024-03-19
  Administered 2024-03-19: 1 via TOPICAL

## 2024-03-19 MED ORDER — FENTANYL CITRATE (PF) 250 MCG/5ML IJ SOLN
INTRAMUSCULAR | Status: AC
  Filled 2024-03-19: qty 5

## 2024-03-19 MED ORDER — SIMETHICONE 80 MG PO CHEW: 80.0000 mg | CHEWABLE_TABLET | Freq: Four times a day (QID) | ORAL | Status: DC | PRN

## 2024-03-19 MED ORDER — METRONIDAZOLE 500 MG/100ML IV SOLN
INTRAVENOUS | Status: AC
  Filled 2024-03-19: qty 100

## 2024-03-19 MED ORDER — LEVOTHYROXINE SODIUM 25 MCG PO TABS
50.0000 ug | ORAL_TABLET | ORAL | Status: DC
  Administered 2024-03-20: 50 ug via ORAL
  Filled 2024-03-19: qty 2

## 2024-03-19 MED ORDER — PHENYLEPHRINE HCL-NACL 20-0.9 MG/250ML-% IV SOLN
INTRAVENOUS | Status: DC | PRN
Start: 2024-03-19 — End: 2024-03-19
  Administered 2024-03-19: 25 ug/min via INTRAVENOUS

## 2024-03-19 MED ORDER — SODIUM CHLORIDE 0.9 % IR SOLN
Status: DC | PRN
  Administered 2024-03-19 (×3): 1000 mL

## 2024-03-19 MED ORDER — SUGAMMADEX SODIUM 200 MG/2ML IV SOLN
INTRAVENOUS | Status: DC | PRN
  Administered 2024-03-19: 180 mg via INTRAVENOUS

## 2024-03-19 MED ORDER — CEFAZOLIN SODIUM-DEXTROSE 2-4 GM/100ML-% IV SOLN
INTRAVENOUS | Status: AC
  Filled 2024-03-19: qty 100

## 2024-03-19 MED ORDER — SCOPOLAMINE 1 MG/3DAYS TD PT72
1.0000 | MEDICATED_PATCH | TRANSDERMAL | Status: DC
  Administered 2024-03-19: 1.5 mg via TRANSDERMAL

## 2024-03-19 MED ORDER — STERILE WATER FOR IRRIGATION IR SOLN
Status: DC | PRN
Start: 2024-03-19 — End: 2024-03-19
  Administered 2024-03-19: 1000 mL

## 2024-03-19 MED ORDER — FENTANYL CITRATE (PF) 250 MCG/5ML IJ SOLN
INTRAMUSCULAR | Status: DC | PRN
  Administered 2024-03-19: 50 ug via INTRAVENOUS
  Administered 2024-03-19: 25 ug via INTRAVENOUS
  Administered 2024-03-19: 50 ug via INTRAVENOUS
  Administered 2024-03-19: 25 ug via INTRAVENOUS
  Administered 2024-03-19: 50 ug via INTRAVENOUS

## 2024-03-19 MED ORDER — SODIUM CHLORIDE (PF) 0.9 % IJ SOLN
INTRAMUSCULAR | Status: AC
  Filled 2024-03-19: qty 100

## 2024-03-19 MED ORDER — ACETAMINOPHEN 500 MG PO TABS
1000.0000 mg | ORAL_TABLET | Freq: Once | ORAL | Status: AC
  Administered 2024-03-19: 1000 mg via ORAL
  Filled 2024-03-19: qty 2

## 2024-03-19 MED ORDER — LIDOCAINE 2% (20 MG/ML) 5 ML SYRINGE
INTRAMUSCULAR | Status: DC | PRN
  Administered 2024-03-19: 60 mg via INTRAVENOUS

## 2024-03-19 MED ORDER — OXYCODONE HCL 5 MG PO TABS
5.0000 mg | ORAL_TABLET | ORAL | Status: DC | PRN
  Administered 2024-03-20: 5 mg via ORAL
  Filled 2024-03-19: qty 1
  Filled 2024-03-19: qty 2

## 2024-03-19 MED ORDER — IBUPROFEN 800 MG PO TABS
800.0000 mg | ORAL_TABLET | Freq: Three times a day (TID) | ORAL | Status: DC
  Administered 2024-03-19 – 2024-03-20 (×4): 800 mg via ORAL
  Filled 2024-03-19 (×4): qty 1

## 2024-03-19 MED ORDER — ONDANSETRON HCL 4 MG/2ML IJ SOLN: 4.0000 mg | Freq: Four times a day (QID) | INTRAMUSCULAR | Status: DC | PRN

## 2024-03-19 MED ORDER — FENTANYL CITRATE (PF) 100 MCG/2ML IJ SOLN
25.0000 ug | INTRAMUSCULAR | Status: DC | PRN
  Administered 2024-03-19 (×2): 50 ug via INTRAVENOUS

## 2024-03-19 MED ORDER — VASOPRESSIN 20 UNIT/ML IV SOLN
INTRAVENOUS | Status: AC
  Filled 2024-03-19: qty 2

## 2024-03-19 MED ORDER — LACTATED RINGERS IV SOLN: INTRAVENOUS | Status: AC

## 2024-03-19 MED ORDER — KETAMINE HCL 10 MG/ML IJ SOLN
INTRAMUSCULAR | Status: DC | PRN
Start: 2024-03-19 — End: 2024-03-19
  Administered 2024-03-19: 20 mg via INTRAVENOUS
  Administered 2024-03-19: 10 mg via INTRAVENOUS

## 2024-03-19 MED ORDER — MAGNESIUM GLYCINATE 120 MG PO CAPS: 2.0000 | ORAL_CAPSULE | Freq: Every day | ORAL | Status: DC

## 2024-03-19 MED ORDER — CHLORHEXIDINE GLUCONATE 0.12 % MT SOLN
15.0000 mL | Freq: Once | OROMUCOSAL | Status: AC
  Administered 2024-03-19: 15 mL via OROMUCOSAL

## 2024-03-19 MED ORDER — HYDROMORPHONE HCL 1 MG/ML IJ SOLN
0.2000 mg | INTRAMUSCULAR | Status: DC | PRN
  Administered 2024-03-19 – 2024-03-20 (×3): 0.6 mg via INTRAVENOUS
  Filled 2024-03-19 (×3): qty 1

## 2024-03-19 MED ORDER — ROCURONIUM BROMIDE 10 MG/ML (PF) SYRINGE
PREFILLED_SYRINGE | INTRAVENOUS | Status: DC | PRN
  Administered 2024-03-19: 10 mg via INTRAVENOUS
  Administered 2024-03-19: 50 mg via INTRAVENOUS
  Administered 2024-03-19: 20 mg via INTRAVENOUS

## 2024-03-19 MED ORDER — LEVOTHYROXINE SODIUM 25 MCG/ML PO SOLN
100.0000 ug | ORAL | Status: DC
  Filled 2024-03-19: qty 4

## 2024-03-19 MED ORDER — MIDAZOLAM HCL 2 MG/2ML IJ SOLN
INTRAMUSCULAR | Status: DC | PRN
  Administered 2024-03-19: 2 mg via INTRAVENOUS

## 2024-03-19 MED ORDER — SCOPOLAMINE 1 MG/3DAYS TD PT72
MEDICATED_PATCH | TRANSDERMAL | Status: AC
  Filled 2024-03-19: qty 1

## 2024-03-19 SURGICAL SUPPLY — 47 items
BAG COUNTER SPONGE SURGICOUNT (BAG) ×2 IMPLANT
BENZOIN TINCTURE PRP APPL 2/3 (GAUZE/BANDAGES/DRESSINGS) IMPLANT
CANISTER SUCTION 3000ML PPV (SUCTIONS) ×2 IMPLANT
COVER MAYO STAND STRL (DRAPES) ×2 IMPLANT
DRAPE CESAREAN BIRTH W POUCH (DRAPES) IMPLANT
DRAPE HYSTEROSCOPY (MISCELLANEOUS) IMPLANT
DRAPE SHEET LG 3/4 BI-LAMINATE (DRAPES) IMPLANT
DRAPE WARM FLUID 44X44 (DRAPES) IMPLANT
DRSG OPSITE POSTOP 4X10 (GAUZE/BANDAGES/DRESSINGS) ×2 IMPLANT
DURAPREP 26ML APPLICATOR (WOUND CARE) ×2 IMPLANT
GAUZE 4X4 16PLY ~~LOC~~+RFID DBL (SPONGE) IMPLANT
GAUZE SPONGE 4X4 12PLY STRL (GAUZE/BANDAGES/DRESSINGS) IMPLANT
GLOVE BIO SURGEON STRL SZ7 (GLOVE) ×2 IMPLANT
GLOVE SURG UNDER POLY LF SZ7 (GLOVE) ×4 IMPLANT
GOWN STRL REUS W/ TWL XL LVL3 (GOWN DISPOSABLE) ×6 IMPLANT
HEMOSTAT ARISTA ABSORB 3G PWDR (HEMOSTASIS) IMPLANT
HIBICLENS CHG 4% 4OZ BTL (MISCELLANEOUS) ×2 IMPLANT
KIT TURNOVER KIT B (KITS) ×2 IMPLANT
LEGGING LITHOTOMY PAIR STRL (DRAPES) IMPLANT
NDL FILTER BLUNT 18X1 1/2 (NEEDLE) IMPLANT
NDL HYPO 22X1.5 SAFETY MO (MISCELLANEOUS) IMPLANT
NEEDLE FILTER BLUNT 18X1 1/2 (NEEDLE) ×2 IMPLANT
NEEDLE HYPO 22X1.5 SAFETY MO (MISCELLANEOUS) ×2 IMPLANT
NS IRRIG 1000ML POUR BTL (IV SOLUTION) IMPLANT
PACK ABDOMINAL GYN (CUSTOM PROCEDURE TRAY) ×2 IMPLANT
PAD ABD 8X10 STRL (GAUZE/BANDAGES/DRESSINGS) IMPLANT
PAD ARMBOARD POSITIONER FOAM (MISCELLANEOUS) ×2 IMPLANT
PAD OB MATERNITY 11 LF (PERSONAL CARE ITEMS) ×2 IMPLANT
RTRCTR C-SECT PINK 34CM XLRG (MISCELLANEOUS) IMPLANT
SET CYSTO W/LG BORE CLAMP LF (SET/KITS/TRAYS/PACK) IMPLANT
SPONGE INTESTINAL PEANUT (DISPOSABLE) IMPLANT
SPONGE T-LAP 18X18 ~~LOC~~+RFID (SPONGE) ×2 IMPLANT
STRIP CLOSURE SKIN 1/2X4 (GAUZE/BANDAGES/DRESSINGS) IMPLANT
SUT PDS AB 0 CTX 60 (SUTURE) IMPLANT
SUT PLAIN 2 0 XLH (SUTURE) IMPLANT
SUT PLAIN ABS 2-0 CT1 27XMFL (SUTURE) IMPLANT
SUT VIC AB 0 CT1 18XCR BRD8 (SUTURE) ×4 IMPLANT
SUT VIC AB 0 CT1 27XBRD ANBCTR (SUTURE) ×4 IMPLANT
SUT VIC AB 2-0 CT1 (SUTURE) ×2 IMPLANT
SUT VIC AB 4-0 KS 27 (SUTURE) ×2 IMPLANT
SYR 10ML LL (SYRINGE) IMPLANT
SYR 5ML LL (SYRINGE) IMPLANT
TAPE STRIPS DRAPE STRL (GAUZE/BANDAGES/DRESSINGS) IMPLANT
TOWEL GREEN STERILE FF (TOWEL DISPOSABLE) ×4 IMPLANT
TRAY FOLEY W/BAG SLVR 14FR (SET/KITS/TRAYS/PACK) ×2 IMPLANT
TUBING TUR DISP (UROLOGICAL SUPPLIES) ×2 IMPLANT
WATER STERILE IRR 1000ML POUR (IV SOLUTION) IMPLANT

## 2024-03-19 NOTE — Plan of Care (Signed)

## 2024-03-19 NOTE — H&P (View-Only) (Signed)
 There has been no change in the patients history, status or exam since the history and physical.  Vitals:   03/12/24 1556 03/19/24 0730  BP:  130/84  Pulse:  85  Resp:  17  Temp:  98.2 F (36.8 C)  TempSrc:  Oral  SpO2:  97%  Weight: 81.6 kg 80.7 kg  Height: 5' 6 (1.676 m) 5' 6 (1.676 m)    Results for orders placed or performed during the hospital encounter of 03/19/24 (from the past 72 hours)  Pregnancy, urine POC     Status: None   Collection Time: 03/19/24  7:17 AM  Result Value Ref Range   Preg Test, Ur NEGATIVE NEGATIVE    Comment:        THE SENSITIVITY OF THIS METHODOLOGY IS >20 mIU/mL.   ABO/Rh     Status: None (Preliminary result)   Collection Time: 03/19/24  7:50 AM  Result Value Ref Range   ABO/RH(D) PENDING    B+ blood type Rosaline DELENA Luna

## 2024-03-19 NOTE — Discharge Instructions (Signed)
  Post Anesthesia Home Care Instructions  Activity: Get plenty of rest for the remainder of the day. A responsible individual must stay with you for 24 hours following the procedure.  For the next 24 hours, DO NOT: -Drive a car -Advertising copywriter -Drink alcoholic beverages -Take any medication unless instructed by your physician -Make any legal decisions or sign important papers.  Meals: Start with liquid foods such as gelatin or soup. Progress to regular foods as tolerated. Avoid greasy, spicy, heavy foods. If nausea and/or vomiting occur, drink only clear liquids until the nausea and/or vomiting subsides. Call your physician if vomiting continues.  Special Instructions/Symptoms: Your throat may feel dry or sore from the anesthesia or the breathing tube placed in your throat during surgery. If this causes discomfort, gargle with warm salt water . The discomfort should disappear within 24 hours.  If you had a scopolamine  patch placed behind your ear for the management of post- operative nausea and/or vomiting:  1. The medication in the patch is effective for 72 hours, after which it should be removed.  Wrap patch in a tissue and discard in the trash. Wash hands thoroughly with soap and water . 2. You may remove the patch earlier than 72 hours if you experience unpleasant side effects which may include dry mouth, dizziness or visual disturbances. 3. Avoid touching the patch. Wash your hands with soap and water  after contact with the patch.    Remove patch behind right ear by Saturday, March 22, 2024.

## 2024-03-19 NOTE — Anesthesia Preprocedure Evaluation (Addendum)
 Anesthesia Evaluation  Patient identified by MRN, date of birth, ID band Patient awake    Reviewed: Allergy & Precautions, NPO status , Patient's Chart, lab work & pertinent test results  History of Anesthesia Complications Negative for: history of anesthetic complications  Airway Mallampati: II  TM Distance: >3 FB Neck ROM: Full    Dental  (+) Dental Advisory Given, Chipped   Pulmonary neg pulmonary ROS   Pulmonary exam normal        Cardiovascular negative cardio ROS Normal cardiovascular exam     Neuro/Psych  Headaches  negative psych ROS   GI/Hepatic negative GI ROS, Neg liver ROS,,,  Endo/Other  Hypothyroidism    Renal/GU negative Renal ROS     Musculoskeletal negative musculoskeletal ROS (+)    Abdominal   Peds  Hematology negative hematology ROS (+)   Anesthesia Other Findings   Reproductive/Obstetrics                              Anesthesia Physical Anesthesia Plan  ASA: 2  Anesthesia Plan: General   Post-op Pain Management: Tylenol  PO (pre-op)*, Toradol IV (intra-op)* and Ketamine  IV*   Induction: Intravenous  PONV Risk Score and Plan: 3 and Treatment may vary due to age or medical condition, Ondansetron , Scopolamine  patch - Pre-op, Midazolam  and Dexamethasone   Airway Management Planned: Oral ETT  Additional Equipment: None  Intra-op Plan:   Post-operative Plan: Extubation in OR  Informed Consent: I have reviewed the patients History and Physical, chart, labs and discussed the procedure including the risks, benefits and alternatives for the proposed anesthesia with the patient or authorized representative who has indicated his/her understanding and acceptance.     Dental advisory given  Plan Discussed with: CRNA and Anesthesiologist  Anesthesia Plan Comments:          Anesthesia Quick Evaluation

## 2024-03-19 NOTE — Anesthesia Procedure Notes (Signed)
 Procedure Name: Intubation Date/Time: 03/19/2024 9:33 AM  Performed by: Cena Epps, CRNAPre-anesthesia Checklist: Patient identified, Emergency Drugs available, Suction available and Patient being monitored Patient Re-evaluated:Patient Re-evaluated prior to induction Oxygen Delivery Method: Circle System Utilized Preoxygenation: Pre-oxygenation with 100% oxygen Induction Type: IV induction Ventilation: Mask ventilation without difficulty Laryngoscope Size: Mac and 3 Grade View: Grade I Tube type: Oral Tube size: 7.0 mm Number of attempts: 1 Airway Equipment and Method: Stylet and Oral airway Placement Confirmation: ETT inserted through vocal cords under direct vision, positive ETCO2 and breath sounds checked- equal and bilateral Secured at: 21 cm Tube secured with: Tape Dental Injury: Teeth and Oropharynx as per pre-operative assessment

## 2024-03-19 NOTE — Transfer of Care (Signed)
 Immediate Anesthesia Transfer of Care Note  Patient: Diane Russell  Procedure(s) Performed: HYSTERECTOMY, TOTAL, ABDOMINAL, WITH SALPINGECTOMY (Bilateral) CYSTOSCOPY  Patient Location: PACU  Anesthesia Type:General  Level of Consciousness: drowsy and patient cooperative  Airway & Oxygen Therapy: Patient Spontanous Breathing and Patient connected to nasal cannula oxygen  Post-op Assessment: Report given to RN and Post -op Vital signs reviewed and stable  Post vital signs: Reviewed and stable  Last Vitals:  Vitals Value Taken Time  BP 90/76 03/19/24 12:39  Temp 36.4 C 03/19/24 12:39  Pulse 69 03/19/24 12:42  Resp 14 03/19/24 12:42  SpO2 100 % 03/19/24 12:42  Vitals shown include unfiled device data.  Last Pain:  Vitals:   03/19/24 0730  TempSrc: Oral  PainSc: 0-No pain      Patients Stated Pain Goal: 6 (03/19/24 0730)  Complications: No notable events documented.

## 2024-03-19 NOTE — Brief Op Note (Signed)
 03/19/2024  12:48 PM  PATIENT:  Shelba DELENA Sauers  48 y.o. female  PRE-OPERATIVE DIAGNOSIS:  enlarged uterus  POST-OPERATIVE DIAGNOSIS:  enlarged uterus  PROCEDURE:  Procedure(s) with comments: HYSTERECTOMY, TOTAL, ABDOMINAL, WITH SALPINGECTOMY (Bilateral) - OPEN TOTAL HYSTERECTOMY WITH BILATERAL SALPINGECTOMY CYSTOSCOPY (N/A)  SURGEON:  Surgeons and Role:    * Sarrah Browning, MD - Primary    * Claire Rubie DELENA, MD - Assisting  ANESTHESIA:   general  EBL:  300 mL   BLOOD ADMINISTERED:none  DRAINS: Urinary Catheter (Foley)   LOCAL MEDICATIONS USED:  OTHER pitressin, fluoroscein  SPECIMEN:  Source of Specimen:  Uterus, tubes, fibroids, cervix; fibroid sent for frozen section, no malignancy seen  DISPOSITION OF SPECIMEN:  PATHOLOGY  COUNTS:  YES  TOURNIQUET:  * No tourniquets in log *  DICTATION: .Note written in EPIC  PLAN OF CARE: Admit to inpatient   PATIENT DISPOSITION:  PACU - hemodynamically stable.   Delay start of Pharmacological VTE agent (>24hrs) due to surgical blood loss or risk of bleeding: not applicable

## 2024-03-19 NOTE — Progress Notes (Signed)
 There has been no change in the patients history, status or exam since the history and physical.  Vitals:   03/12/24 1556 03/19/24 0730  BP:  130/84  Pulse:  85  Resp:  17  Temp:  98.2 F (36.8 C)  TempSrc:  Oral  SpO2:  97%  Weight: 81.6 kg 80.7 kg  Height: 5' 6 (1.676 m) 5' 6 (1.676 m)    Results for orders placed or performed during the hospital encounter of 03/19/24 (from the past 72 hours)  Pregnancy, urine POC     Status: None   Collection Time: 03/19/24  7:17 AM  Result Value Ref Range   Preg Test, Ur NEGATIVE NEGATIVE    Comment:        THE SENSITIVITY OF THIS METHODOLOGY IS >20 mIU/mL.   ABO/Rh     Status: None (Preliminary result)   Collection Time: 03/19/24  7:50 AM  Result Value Ref Range   ABO/RH(D) PENDING    B+ blood type Rosaline DELENA Luna

## 2024-03-19 NOTE — Interval H&P Note (Signed)
 History and Physical Interval Note:  03/19/2024 9:08 AM  Diane Russell  has presented today for surgery, with the diagnosis of enlarged uterus.  The various methods of treatment have been discussed with the patient and family. After consideration of risks, benefits and other options for treatment, the patient has consented to  Procedure(s) with comments: HYSTERECTOMY, TOTAL, ABDOMINAL, WITH SALPINGECTOMY (Bilateral) - OPEN TOTAL HYSTERECTOMY WITH BILATERAL SALPINGECTOMY CYSTOSCOPY (N/A) as a surgical intervention.  The patient's history has been reviewed, patient examined, no change in status, stable for surgery.  I have reviewed the patient's chart and labs.  Questions were answered to the patient's satisfaction.     Diane Russell

## 2024-03-19 NOTE — Op Note (Signed)
 03/19/2024  12:48 PM  PATIENT:  Diane Russell  48 y.o. female  PRE-OPERATIVE DIAGNOSIS:  enlarged uterus  POST-OPERATIVE DIAGNOSIS:  enlarged uterus  PROCEDURE:  Procedure(s) with comments: HYSTERECTOMY, TOTAL, ABDOMINAL, WITH SALPINGECTOMY (Bilateral) - OPEN TOTAL HYSTERECTOMY WITH BILATERAL SALPINGECTOMY CYSTOSCOPY (N/A)  SURGEON:  Surgeons and Role:    * Sarrah Browning, MD - Primary    * Claire Rubie DELENA, MD - Assisting  ANESTHESIA:   general  EBL:  300 mL   BLOOD ADMINISTERED:none  DRAINS: Urinary Catheter (Foley)   LOCAL MEDICATIONS USED:  OTHER pitressin, fluoroscein  SPECIMEN:  Source of Specimen:  Uterus, tubes, fibroids, cervix; fibroid sent for frozen section, no malignancy seen  DISPOSITION OF SPECIMEN:  PATHOLOGY  COUNTS:  YES  TOURNIQUET:  * No tourniquets in log *  DICTATION: .Note written in EPIC  PLAN OF CARE: Admit to inpatient   PATIENT DISPOSITION:  PACU - hemodynamically stable.   Delay start of Pharmacological VTE agent (>24hrs) due to surgical blood loss or risk of bleeding: not applicable  Complications: none.  Medications:     Findings: 25 week size uterine complex with fibroids.  Fibroid was at least 30 cm and mostly replaced the cervix although there was some still able to be seen and felt.  The capsule was smooth and without excrescences.  Ovaries were normal bilaterally. Ureters were seen multiple times pulsing but were below the field of dissection.  On cysto, the bladder was intact and the ureters were functioning.  Technique:  After adequate general anesthesia was achieved, the patient was prepped and draped in the usual sterile fashion after the foley had been placed.  A vertical incision was made with the scalpel and carried down to the fascia.  The fascia was incised with the scalpel in a transverse manner and extended in a transverse curvilinear manner.  The rectus muscles were split in the midline and a bowel free  portion of the peritoneum was tented up.  It was entered into with the hemostat and extended in a superior and inferior manner with good visualization of the bowel and bladder.  The uterus was mobilized as much as possible from and the Alexis retractor was placed after the bowel was packed away and the bladder retracted.     The round ligaments were suture ligated with 0-vicryl bilaterally and incised with the bovie.  The bovie was then used to incise a transverse curvilinear incision in the vesico-cervical fascia. The incisions were extended to parallel the IP ligaments and the uterine arteries were skeletonized.  The mesosalpinx bilaterally was incised with the bovie cautery and removed from the ovaries.  The uterine-ovarian ligaments bilaterally were then clamped with heaney clamps and incised with the mayo scissors.  Each pedicle was then secured with a free tie and then a suture ligation.  The bladder flap was then retracted inferiorly with blunt and cautery dissection below the external cervical os.  The uterine arteries were clamped bilaterally with the heaneys and incised with the mayos.  Each pedicle was then secured with a stitch of 0-vicryl.  A second bite with the straight heaney was done bilaterally and secured with 0-vicryl.  The serosa of the uterus and fibroid were peeled down until the lower pelvis was seen. Unfortunatly, the cardinal ligaments were involved with the fibroid and only the R ureter could be seen.  We decided then to slice off portions of the fibroid, the first being sent for frozen section.  Pitressin was injected  in multiple areas and the fibroid was removed in three pieces,well above the internal cervical os and away from the ureters.  The bladder had been retracted inferiorly. The bulk of the uterus fibroid was then amputed with the scalpel and the fibroid/cervical stump was held with kochers.  The frozen section came back benign fibroid.  The cardinal ligament was then  divided with alternating successive bites of the straight heaney, incised with the scalpel and then secured with 0-vicryl.  At the level of the reflection of the vagina onto the cervix, a curved heaney was placed and the cervix removed with the jorgensen scissors.  The cuff angles were then secured with 0-vicryl and the cuff was closed with interrupted figure of eight stitches of 0-vicryl.  Hemostasis was insured and the ureters were peristalsing well out of the field of dissection.  Irrigation was performed and hemostasis was assured. Arista was placed after a couple superficial stitches were placed posterior vagina for hemostasis.  The instruments and laps were removed from the abdominal cavity and the peritoneum was closed with a running stitch of 2-vicryl which incorporated the rectus muscles as a separate layer.  The fascia was closed with looped 0 PDS in a running manner.  The subcutaneous tissue was closed with interrupted stitches of 3-0 plain gut after hemostasis was achieved with the bovie and irrigation.  The skin was closed with 3-0 vicryl sub-q with a keith needle.  Pt tolerated the procedure well and was transferred to the recovery room in stable condition.

## 2024-03-19 NOTE — Anesthesia Postprocedure Evaluation (Signed)
 Anesthesia Post Note  Patient: Diane Russell  Procedure(s) Performed: HYSTERECTOMY, TOTAL, ABDOMINAL, WITH SALPINGECTOMY (Bilateral) CYSTOSCOPY     Patient location during evaluation: PACU Anesthesia Type: General Level of consciousness: awake and alert Pain management: pain level controlled Vital Signs Assessment: post-procedure vital signs reviewed and stable Respiratory status: spontaneous breathing, nonlabored ventilation and respiratory function stable Cardiovascular status: stable and blood pressure returned to baseline Anesthetic complications: no   No notable events documented.  Last Vitals:  Vitals:   03/19/24 1315 03/19/24 1330  BP: (!) 103/54 (!) 97/55  Pulse: 64 61  Resp: 13 10  Temp:    SpO2: 99% 100%    Last Pain:  Vitals:   03/19/24 1255  TempSrc:   PainSc: 5                  [name] Like

## 2024-03-20 ENCOUNTER — Encounter (HOSPITAL_COMMUNITY): Payer: Self-pay | Admitting: Obstetrics and Gynecology

## 2024-03-20 LAB — CBC
HCT: 31.9 % — ABNORMAL LOW (ref 36.0–46.0)
Hemoglobin: 10.4 g/dL — ABNORMAL LOW (ref 12.0–15.0)
MCH: 27.8 pg (ref 26.0–34.0)
MCHC: 32.6 g/dL (ref 30.0–36.0)
MCV: 85.3 fL (ref 80.0–100.0)
Platelets: 293 K/uL (ref 150–400)
RBC: 3.74 MIL/uL — ABNORMAL LOW (ref 3.87–5.11)
RDW: 12.5 % (ref 11.5–15.5)
WBC: 11.6 K/uL — ABNORMAL HIGH (ref 4.0–10.5)
nRBC: 0 % (ref 0.0–0.2)

## 2024-03-20 MED ORDER — IBUPROFEN 800 MG PO TABS: 800.0000 mg | ORAL_TABLET | Freq: Three times a day (TID) | ORAL | 0 refills | Status: AC

## 2024-03-20 MED ORDER — OXYCODONE HCL 5 MG PO TABS: 5.0000 mg | ORAL_TABLET | ORAL | 0 refills | Status: AC | PRN

## 2024-03-20 NOTE — Discharge Summary (Signed)
 Physician Discharge Summary  Patient ID: Diane Russell MRN: [id number] DOB/AGE: 50-07-1976 50 y.o.  Admit date: 03/19/2024 Discharge date: 03/20/2024  Admission Diagnoses: symptomatic fibroid uterus  Discharge Diagnoses:same  Principal Problem:   Post-operative state Active Problems:   Postoperative state   Discharged Condition: good  Hospital Course: Uncomplicated TAH/salpingectomies.  Cysto done intra-op to make sure intact bladder and spill from ureters.  Normal post op course.  Consults: None  Significant Diagnostic Studies: labs: postop h/h Results for orders placed or performed during the hospital encounter of 03/19/24 (from the past 24 hours)  CBC     Status: Abnormal   Collection Time: 03/19/24 10:44 PM  Result Value Ref Range   WBC 12.1 (H) 4.0 - 10.5 K/uL   RBC 4.04 3.87 - 5.11 MIL/uL   Hemoglobin 11.5 (L) 12.0 - 15.0 g/dL   HCT 65.1 (L) 63.9 - 53.9 %   MCV 86.1 80.0 - 100.0 fL   MCH 28.5 26.0 - 34.0 pg   MCHC 33.0 30.0 - 36.0 g/dL   RDW 87.5 88.4 - 84.4 %   Platelets 289 150 - 400 K/uL   nRBC 0.0 0.0 - 0.2 %  CBC     Status: Abnormal   Collection Time: 03/20/24  5:15 AM  Result Value Ref Range   WBC 11.6 (H) 4.0 - 10.5 K/uL   RBC 3.74 (L) 3.87 - 5.11 MIL/uL   Hemoglobin 10.4 (L) 12.0 - 15.0 g/dL   HCT 68.0 (L) 63.9 - 53.9 %   MCV 85.3 80.0 - 100.0 fL   MCH 27.8 26.0 - 34.0 pg   MCHC 32.6 30.0 - 36.0 g/dL   RDW 87.4 88.4 - 84.4 %   Platelets 293 150 - 400 K/uL   nRBC 0.0 0.0 - 0.2 %     Treatments: surgery: Uncomplicated TAH/salpingectomies.  Discharge Exam: Blood pressure 120/70, pulse 95, temperature 98.3 F (36.8 C), temperature source Oral, resp. rate 18, height 5' 6 (1.676 m), weight 80.7 kg, SpO2 98%.   Disposition: Discharge disposition: 01-Home or Self Care       Discharge Instructions     Call MD for:  temperature >100.4   Complete by: As directed    Diet - low sodium heart healthy   Complete by: As directed    Discharge  instructions   Complete by: As directed    No driving on narcotics, no sexual activity for 2 weeks.   Increase activity slowly   Complete by: As directed    May shower / Bathe   Complete by: As directed    Shower, no bath for 2 weeks.   Remove dressing in 24 hours   Complete by: As directed    Sexual Activity Restrictions   Complete by: As directed    No sexual activity for 2 weeks.      Allergies as of 03/20/2024   No Known Allergies      Medication List     STOP taking these medications    Mirena (52 MG) 20 MCG/24HR IUD Generic drug: levonorgestrel       TAKE these medications    Aleve-D Sinus & Cold 120-220 MG Tb12 Generic drug: Pseudoephedrine-Naproxen Na Take 1 tablet by mouth daily as needed (for congestion).   aspirin-acetaminophen -caffeine 250-250-65 MG tablet Commonly known as: EXCEDRIN MIGRAINE Take 1-2 tablets by mouth every 6 (six) hours as needed for headache.   fluticasone  50 MCG/ACT nasal spray Commonly known as: FLONASE  Place 1 spray into both nostrils daily  as needed for allergies.   ibuprofen  800 MG tablet Commonly known as: ADVIL  Take 1 tablet (800 mg total) by mouth 3 (three) times daily.   levothyroxine  50 MCG tablet Commonly known as: SYNTHROID  Take 50 mcg by mouth as directed. Takes one tablet 5 days a week  and takes two tablets two days a week  every morning   liothyronine  5 MCG tablet Commonly known as: CYTOMEL  Take 5 mcg by mouth daily.   Magnesium  Glycinate 120 MG Caps Take 2-3 capsules by mouth at bedtime.   oxyCODONE  5 MG immediate release tablet Commonly known as: Oxy IR/ROXICODONE  Take 1-2 tablets (5-10 mg total) by mouth every 4 (four) hours as needed for moderate pain (pain score 4-6).        Follow-up Information     Sarrah Browning, MD Follow up in 2 week(s).   Specialty: Obstetrics and Gynecology Contact information: [address] [place] [address] [phone]                  Signed: Browning DELENA Sarrah 03/20/2024, 3:44 PM

## 2024-03-20 NOTE — Progress Notes (Signed)
 Patient is eating, ambulating, still voiding with foley.  Pain control is good.  Vitals:   03/19/24 1935 03/19/24 2150 03/19/24 2200 03/19/24 2335  BP: 122/62 116/67 109/64 (!) 117/58  Pulse: 89 95 71 93  Resp: 16 18 18 17   Temp: 98.4 F (36.9 C)   98.3 F (36.8 C)  TempSrc: Oral   Oral  SpO2: 96%   100%  Weight:      Height:        lungs:   clear to auscultation cor:    RRR Abdomen:  soft, appropriate tenderness, incisions intact and without erythema or exudate. ex:    no cords   Lab Results  Component Value Date   WBC 11.6 (H) 03/20/2024   HGB 10.4 (L) 03/20/2024   HCT 31.9 (L) 03/20/2024   MCV 85.3 03/20/2024   PLT 293 03/20/2024    A/P  POD #1 TAH/salpingectomies. H/H stable but lower than expected- will have pt ambulate carefully.  Urine out put has been outstanding.  Routine care.  Expect d/c per plan- probably tomorrow.

## 2024-03-20 NOTE — Plan of Care (Signed)

## 2024-04-02 ENCOUNTER — Telehealth: Payer: Self-pay | Admitting: *Deleted

## 2024-04-02 NOTE — Telephone Encounter (Signed)
 Spoke with the patient regarding the referral to GYN oncology. Patient scheduled as new patient with Dr Viktoria on 8/29 at 11:15 am.  Patient given an arrival time of *10:45 am.  Explained to the patient the the doctor will perform a pelvic exam at this visit. Patient given the policy that only one visitor allowed and that visitor must be over 16 yrs are allowed in the Cancer Center. Patient given the address/phone number for the clinic and that the center offers free valet service. Patient aware that masks required.

## 2024-04-09 ENCOUNTER — Encounter: Payer: Self-pay | Admitting: Gynecologic Oncology

## 2024-04-09 NOTE — Progress Notes (Unsigned)
 GYNECOLOGIC ONCOLOGY NEW PATIENT CONSULTATION   Patient Name: Diane Russell  Patient Age: 48 y.o. Date of Service: 04/10/24 Referring Provider: Sarrah Browning, MD 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  Patient is overall healing well from surgery.  She has second follow-up with her OB/GYN in early October.  Exam reassuring today.  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  While these tumors have features that do not fulfill the criteria for diagnosis of leiomyosarcoma, they have some pathology findings seen in these aggressive cancers.  Hysterectomy is considered the standard of care for managing uterine STUMP although uterine preservation can be considered if fertility desired.  Ovarian preservation is also possible depending on patient's age and fertility desires.  We discussed adding estrogen receptor testing since the patient's ovaries were left in situ.  We will discuss decision about leaving the ovaries versus planning for another surgery in the future pending these results.  With regard to adjuvant therapy, there is no consensus regarding the use of adjuvant hormonal therapy or chemotherapy.  Recommendation is for close surveillance.  After hysterectomy, we discussed plan for follow-up every 6 months with regular imaging.  There is no consensus on with his regular imaging should include, we discussed yearly CT scan with chest x-ray and abdominal pelvic ultrasound at 6 months between CT imaging.  Because she did not have any chest imaging preoperatively, I recommend that we get a chest x-ray today.  We will then plan on CT of the chest, abdomen, pelvis at her follow-up visit with me in 6 months.  The risk of  recurrence of a uterine STUMP is estimated to be between 3 and 27%, is frequently estimated to be closer to 9-11%.  These tumors can recur as STUMP tumors or as leiomyosarcomas.  These recurrences can happen late, often occurring more than 5 years after initial diagnosis.  Recurrences are frequently treated with surgical resection.  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   With her family history of thyroid  and breast cancer, will consider genetics testing.  The patient and I will have a follow-up visit in the next several weeks to review her chest x-ray, estrogen receptor testing, and hopefully her genomic testing.  A copy of this note was sent to the patient's referring provider.   60 minutes of total time was spent for this patient encounter, including preparation, face-to-face counseling with the patient and coordination of care, and documentation of the encounter.  Comer Dollar, MD  Division of Gynecologic Oncology  Department of Obstetrics and Gynecology  [facility]  ___________________________________________  Chief Complaint: Chief Complaint  Patient presents with   smooth muschle tumor    History of Present Illness:  Diane Russell is a 48 y.o. y.o. female who is seen in consultation at the request of Sarrah Browning, MD for an evaluation of uterine STUMP.  The patient reports being basically amenorrheic with a Mirena IUD in place.  She was due to have her IUD changed but on exam, uterus was noted to be quite enlarged.  MRI of the abdomen and pelvis was performed on 03/02/2024 showing a uterus is enlarged by a  solitary, large enhancing fibroid coming from the posterior uterine fundus measuring 22.8 x 11.8 x 21.5 cm.  This extends well into the abdomen and effaces the endometrial cavity.  IUD  present at the fundus.  Ovaries displaced and difficult to clearly visualize but without obvious abnormality.  Sigmoid diverticulosis without acute diverticulitis noted.  On 03/19/2024, patient underwent total abdominal hysterectomy with bilateral salpingectomy, cystoscopy.  Findings at the time of surgery were notable for 25-week size uterine complex with fibroids.  Fibroid was at least 30 cm and mostly replaced the cervix although there was still some that was able to be seen and felt.  The capsule was smooth and without excrescences.  Ovaries normal bilaterally. 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  Within the uterus and cervix specimen, smooth muscle tumor of uncertain malignant potential of the myometrium noted.  Cervix with nabothian cyst and squamous metaplasia.  Weakly proliferative endometrium.  Bilateral fallopian tubes without abnormalities.  Patient comes in today by herself.  She notes overall doing very well.  She denies any significant symptoms prior to her surgery other than looking like she was pregnant.  Since surgery, she has been walking 1 to 2 miles a day.  She still has some soreness as well as some fatigue.  She endorses improving bowel function with a bowel movement every 1 or 2 days.  She is using magnesium  at night and smooth move as needed.  She has some discomfort when her bladder is full, which is improving.  PAST MEDICAL HISTORY:  Past Medical History:  Diagnosis Date   Hypothyroidism, postsurgical 2018   endocrinology--- dr loise. ary   Migraines    none since hysterectomy   Primary malignant neoplasm of thyroid  gland [facility]) 08/2016   [facility] in [place]-- Dr GEANNIE ary;   07-18-2016 FNA bx right thyroid  nodule bethesda V;  08-28-2016 s/p right thyroid  lobecotmy;  Stage I,  no further treatment;   monitored yearly;  (lov in epic 05-09-2023 w/ US  ,  stable left nodules,  right side no evidence recurrence and no  residual tissue thyroid  bed post resection   Seasonal allergies    Uterine fibroid    symptomatic   Wears contact lenses      PAST SURGICAL HISTORY:  Past Surgical History:  Procedure Laterality Date   COLONOSCOPY WITH PROPOFOL   07/23/2023   @ [facility]   CYSTOSCOPY N/A 03/19/2024   Procedure: CYSTOSCOPY;  Surgeon: Sarrah Browning, MD;  Location: [facility] OR;  Service: Gynecology;  Laterality: N/A;   HYSTERECTOMY ABDOMINAL WITH SALPINGECTOMY Bilateral 03/19/2024   Procedure: HYSTERECTOMY, TOTAL, ABDOMINAL, WITH SALPINGECTOMY;  Surgeon: Sarrah Browning, MD;  Location: [facility] OR;  Service: Gynecology;  Laterality: Bilateral;  OPEN TOTAL HYSTERECTOMY WITH BILATERAL SALPINGECTOMY   THYROID  LOBECTOMY Right 08/28/2016   Procedure: RIGHT THYROID  LOBECTOMY;  Surgeon: Krystal Spinner, MD;  Location: [facility]S;  Service: General;  Laterality: Right;   WISDOM TOOTH EXTRACTION      OB/GYN HISTORY:  OB History  Gravida Para Term Preterm AB Living  2 2      SAB IAB Ectopic Multiple Live Births          # Outcome Date GA Lbr Len/2nd Weight Sex Type Anes PTL Lv  2 Para           1 Para             No LMP recorded. (Menstrual status: IUD).  Age at menarche: 28  Age at menopause: n/a  Hx of STDs: denies Last pap: 2024 - NILM, HR HPV negative History of abnormal pap smears: yes  SCREENING STUDIES:  Last mammogram: 2025  Last colonoscopy: 2024  MEDICATIONS: Outpatient Encounter Medications as of 04/10/2024  Medication Sig   levothyroxine  (SYNTHROID ) 50 MCG tablet Take 50 mcg by mouth as directed. Takes one tablet 5 days a week  and takes two tablets two days a week  every morning   liothyronine  (CYTOMEL ) 5 MCG tablet Take 5 mcg by mouth daily.   Magnesium  Glycinate 120 MG CAPS Take 2-3 capsules by mouth at bedtime.   oxyCODONE  (OXY IR/ROXICODONE ) 5 MG immediate release tablet Take 1-2 tablets (5-10 mg total) by mouth every 4 (four) hours as needed for moderate pain (pain score 4-6).    Pseudoephedrine-Naproxen Na (ALEVE-D SINUS & COLD) 120-220 MG TB12 Take 1 tablet by mouth daily as needed (for congestion).   aspirin-acetaminophen -caffeine (EXCEDRIN MIGRAINE) 250-250-65 MG tablet Take 1-2 tablets by mouth every 6 (six) hours as needed for headache. (Patient not taking: Reported on 04/09/2024)   fluticasone  (FLONASE ) 50 MCG/ACT nasal spray Place 1 spray into both nostrils daily as needed for allergies. (Patient not taking: Reported on 04/09/2024)   ibuprofen  (ADVIL ) 800 MG tablet Take 1 tablet (800 mg total) by mouth 3 (three) times daily. (Patient not taking: Reported on 04/09/2024)   No facility-administered encounter medications on file as of 04/10/2024.    ALLERGIES:  No Known Allergies   FAMILY HISTORY:  Family History  Problem Relation Age of Onset   Diabetes Maternal Grandmother    Heart disease Maternal Grandfather    Melanoma Maternal Grandfather    Breast cancer Paternal Grandmother    Bladder Cancer Paternal Grandmother    Heart disease Paternal Grandmother    Breast cancer Maternal Aunt    Thyroid  cancer Maternal Aunt    Thyroid  cancer Maternal Uncle    Colon cancer Neg Hx    Pancreatic cancer Neg Hx    Ovarian cancer Neg Hx    Endometrial cancer Neg Hx    Hypertension Neg Hx    Prostate cancer Neg Hx      SOCIAL HISTORY:  Social Connections: Not on file    REVIEW OF SYSTEMS:  + easy bruising Denies appetite changes, fevers, chills, fatigue, unexplained weight changes. Denies hearing loss, neck lumps or masses, mouth sores, ringing in ears or voice changes. Denies cough or wheezing.  Denies shortness of breath. Denies chest pain or palpitations. Denies leg swelling. Denies abdominal distention, pain, blood in stools, constipation, diarrhea, nausea, vomiting, or early satiety. Denies pain with intercourse, dysuria, frequency, hematuria or incontinence. Denies hot flashes, pelvic pain, vaginal bleeding or vaginal discharge.   Denies joint pain, back  pain or muscle pain/cramps. Denies itching, rash, or wounds. Denies dizziness, headaches, numbness or seizures. Denies swollen lymph nodes or glands, denies easy bleeding. Denies anxiety, depression, confusion, or decreased concentration.  Physical Exam:  Vital Signs for this encounter:  Blood pressure 122/82, pulse 91, temperature 98.7 F (37.1 C), temperature source Oral, resp. rate 18, height 5' 6 (1.676 m), weight 169 lb (76.7 kg), SpO2 99%. Body mass index is 27.28 kg/m. General: Alert, oriented, no acute distress.  HEENT: Normocephalic, atraumatic. Sclera anicteric.  Chest: Clear to auscultation bilaterally. No wheezes, rhonchi, or rales. Cardiovascular: Regular rate and rhythm, no murmurs, rubs, or gallops.  Abdomen: Normoactive bowel sounds. Soft, nondistended, nontender to palpation. No masses or hepatosplenomegaly appreciated. No palpable fluid wave.  Well-healing midline incision. Extremities: Grossly normal  range of motion. Warm, well perfused. No edema bilaterally.  Skin: No rashes or lesions.  Lymphatics: No cervical, supraclavicular, or inguinal adenopathy.  GU:  Normal external female genitalia.   No lesions. No discharge or bleeding.             Bladder/urethra:  No lesions or masses, well supported bladder             Vagina: Rugated.  Small amount of discharge at the vaginal apex.             Cervix/parous: Surgically absent.  Vaginal cuff is intact.  On bimanual exam, no fluctuance or tenderness.             Adnexa: No masses palpated.  Rectal: Deferred.  LABORATORY AND RADIOLOGIC DATA:  Outside medical records were reviewed to synthesize the above history, along with the history and physical obtained during the visit.   Lab Results  Component Value Date   WBC 11.6 (H) 03/20/2024   HGB 10.4 (L) 03/20/2024   HCT 31.9 (L) 03/20/2024   PLT 293 03/20/2024

## 2024-04-10 ENCOUNTER — Telehealth: Payer: Self-pay

## 2024-04-10 ENCOUNTER — Ambulatory Visit (HOSPITAL_COMMUNITY)
Admission: RE | Admit: 2024-04-10 | Discharge: 2024-04-10 | Disposition: A | Discharge status: Home / Self Care | Payer: PRIVATE HEALTH INSURANCE | Source: Ambulatory Visit | Attending: Gynecologic Oncology | Admitting: Gynecologic Oncology

## 2024-04-10 ENCOUNTER — Other Ambulatory Visit: Payer: Self-pay

## 2024-04-10 ENCOUNTER — Encounter: Payer: Self-pay | Admitting: Gynecologic Oncology

## 2024-04-10 ENCOUNTER — Encounter: Payer: Self-pay | Admitting: Oncology

## 2024-04-10 ENCOUNTER — Inpatient Hospital Stay: Payer: PRIVATE HEALTH INSURANCE | Attending: Gynecologic Oncology | Admitting: Gynecologic Oncology

## 2024-04-10 ENCOUNTER — Inpatient Hospital Stay
Admission: RE | Admit: 2024-04-10 | Discharge: 2024-04-10 | Disposition: A | Discharge status: Home / Self Care | Payer: Self-pay | Source: Ambulatory Visit | Attending: Gynecologic Oncology | Admitting: Gynecologic Oncology

## 2024-04-10 VITALS — BP 122/82 | HR 91 | Temp 98.7°F | Resp 18 | Ht 66.0 in | Wt 169.0 lb

## 2024-04-10 DIAGNOSIS — E890 Postprocedural hypothyroidism: Secondary | ICD-10-CM | POA: Insufficient documentation

## 2024-04-10 DIAGNOSIS — Z9079 Acquired absence of other genital organ(s): Secondary | ICD-10-CM | POA: Insufficient documentation

## 2024-04-10 DIAGNOSIS — Z803 Family history of malignant neoplasm of breast: Secondary | ICD-10-CM | POA: Insufficient documentation

## 2024-04-10 DIAGNOSIS — D390 Neoplasm of uncertain behavior of uterus: Secondary | ICD-10-CM | POA: Insufficient documentation

## 2024-04-10 DIAGNOSIS — Z8349 Family history of other endocrine, nutritional and metabolic diseases: Secondary | ICD-10-CM | POA: Insufficient documentation

## 2024-04-10 DIAGNOSIS — Z90722 Acquired absence of ovaries, bilateral: Secondary | ICD-10-CM | POA: Insufficient documentation

## 2024-04-10 DIAGNOSIS — Z90710 Acquired absence of both cervix and uterus: Secondary | ICD-10-CM | POA: Diagnosis not present

## 2024-04-10 DIAGNOSIS — Z9889 Other specified postprocedural states: Secondary | ICD-10-CM

## 2024-04-10 DIAGNOSIS — Z85850 Personal history of malignant neoplasm of thyroid: Secondary | ICD-10-CM | POA: Insufficient documentation

## 2024-04-10 DIAGNOSIS — D440 Neoplasm of uncertain behavior of thyroid gland: Secondary | ICD-10-CM

## 2024-04-10 DIAGNOSIS — Z808 Family history of malignant neoplasm of other organs or systems: Secondary | ICD-10-CM | POA: Insufficient documentation

## 2024-04-10 DIAGNOSIS — Z79890 Hormone replacement therapy (postmenopausal): Secondary | ICD-10-CM | POA: Insufficient documentation

## 2024-04-10 DIAGNOSIS — Z8052 Family history of malignant neoplasm of bladder: Secondary | ICD-10-CM | POA: Insufficient documentation

## 2024-04-10 NOTE — Telephone Encounter (Addendum)
 Per the request of Dr.Tucker, I reached out to [facility] regarding recent MRI pt received on 7/21.   Per Dorothyann, she will send request to medical records and let me know if and when we can expect images for [facility] phone number [phone]

## 2024-04-10 NOTE — Patient Instructions (Signed)
 It was very nice to meet you today.  We discussed your STUMP diagnosis (smooth tumor of uncertain malignant potential), which is best described as a precancer.  While no additional treatment is required currently, we know that there is a risk that this comes back, either as a another STUMP or as the more aggressive sarcoma we talked about (LMS or leiomyosarcoma).  This can happen sometime after your original diagnosis so we follow you longer-term.  In the next few weeks, we will plan on doing several additional tests: Today, we will get a chest x-ray to make sure we do not see any concerning lung spots.  Luckily, your recent MRI did not show any concerning areas outside of the uterus in your abdomen or pelvis.  My office will work on getting these images pushed into our system so that I can review them. My office will have pathology add estrogen receptor testing to your tumor and we will asked that the tumor be sent out for the special genetic testing I mentioned that would help make sure that the diagnosis of STUMP is correct.  We will have a phone call in a couple of weeks to discuss the above results.  One of the things we reviewed today is the decision about whether to consider surgery to remove your ovaries at some point in the future.  I do not feel strongly that this needs to happen but lets see what your estrogen receptor testing looks like on the tumor.  In terms of follow-up, we discussed that there is no standard for follow-up given this rare diagnosis.  Typically, I recommend clinic visits with me every 6 months and imaging every 6 months alternating between a CT scan and a chest x-ray with abdominal and pelvic ultrasound.  If you develop any of the symptoms that we reviewed today (including pelvic or abdominal pain, change to bowel or bladder function, unintentional weight loss, shortness of breath, or anything new and concerning) between visits, please call to see me sooner.

## 2024-04-10 NOTE — Progress Notes (Signed)
 Requested ER testing on accession [id number] with [facility].  Also requested gene rearrangement testing.

## 2024-04-15 ENCOUNTER — Encounter: Payer: Self-pay | Admitting: Obstetrics and Gynecology

## 2024-04-15 NOTE — Telephone Encounter (Signed)
 Images have been power shared and are ready for review.   Message sent to Dr.Tucker

## 2024-04-21 ENCOUNTER — Ambulatory Visit: Payer: Self-pay | Admitting: Gynecologic Oncology

## 2024-04-22 LAB — SURGICAL PATHOLOGY

## 2024-04-24 ENCOUNTER — Inpatient Hospital Stay: Payer: PRIVATE HEALTH INSURANCE | Attending: Gynecologic Oncology | Admitting: Gynecologic Oncology

## 2024-04-24 DIAGNOSIS — Z9079 Acquired absence of other genital organ(s): Secondary | ICD-10-CM

## 2024-04-24 DIAGNOSIS — D440 Neoplasm of uncertain behavior of thyroid gland: Secondary | ICD-10-CM

## 2024-04-24 DIAGNOSIS — D390 Neoplasm of uncertain behavior of uterus: Secondary | ICD-10-CM | POA: Insufficient documentation

## 2024-04-24 DIAGNOSIS — Z90710 Acquired absence of both cervix and uterus: Secondary | ICD-10-CM

## 2024-04-24 NOTE — Progress Notes (Signed)
 Gynecologic Oncology Telehealth Note: Gyn-Onc  I connected with Shelba DELENA Sauers on 04/24/24 at  6:00 PM EDT by telephone and verified that I am speaking with the correct person using two identifiers.  I discussed the limitations, risks, security and privacy concerns of performing an evaluation and management service by telemedicine and the availability of in-person appointments. I also discussed with the patient that there may be a patient responsible charge related to this service. The patient expressed understanding and agreed to proceed.  Other persons participating in the visit and their role in the encounter: none.  Patient's location: [place] Provider's location: [place]  Reason for Visit: follow-up  Treatment History: The patient reports being basically amenorrheic with a Mirena IUD in place.  She was due to have her IUD changed but on exam, uterus was noted to be quite enlarged.   MRI of the abdomen and pelvis was performed on 03/02/2024 showing a uterus is enlarged by a solitary, large enhancing fibroid coming from the posterior uterine fundus measuring 22.8 x 11.8 x 21.5 cm.  This extends well into the abdomen and effaces the endometrial cavity.  IUD present at the fundus.  Ovaries displaced and difficult to clearly visualize but without obvious abnormality.  Sigmoid diverticulosis without acute diverticulitis noted.   On 03/19/2024, patient underwent total abdominal hysterectomy with bilateral salpingectomy, cystoscopy.  Findings at the time of surgery were notable for 25-week size uterine complex with fibroids.  Fibroid was at least 30 cm and mostly replaced the cervix although there was still some that was able to be seen and felt.  The capsule was smooth and without excrescences.  Ovaries normal bilaterally. 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  Within the uterus and cervix specimen, smooth muscle tumor of uncertain malignant potential of the  myometrium noted.  Cervix with nabothian cyst and squamous metaplasia.  Weakly proliferative endometrium.  Bilateral fallopian tubes without abnormalities.   Interval History: Doing well.  Past Medical/Surgical History: Past Medical History:  Diagnosis Date   Hypothyroidism, postsurgical 2018   endocrinology--- dr loise. ary   Migraines    none since hysterectomy   Primary malignant neoplasm of thyroid  gland [facility]) 08/2016   [facility] in [place]-- Dr GEANNIE ary;   07-18-2016 FNA bx right thyroid  nodule bethesda V;  08-28-2016 s/p right thyroid  lobecotmy;  Stage I,  no further treatment;   monitored yearly;  (lov in epic 05-09-2023 w/ US  ,  stable left nodules,  right side no evidence recurrence and no residual tissue thyroid  bed post resection   Seasonal allergies    Uterine fibroid    symptomatic   Wears contact lenses     Past Surgical History:  Procedure Laterality Date   COLONOSCOPY WITH PROPOFOL   07/23/2023   @ [facility]   CYSTOSCOPY N/A 03/19/2024   Procedure: CYSTOSCOPY;  Surgeon: Sarrah Browning, MD;  Location[facility] OR;  Service: Gynecology;  Laterality: N/A;   HYSTERECTOMY ABDOMINAL WITH SALPINGECTOMY Bilateral 03/19/2024   Procedure: HYSTERECTOMY, TOTAL, ABDOMINAL, WITH SALPINGECTOMY;  Surgeon: Sarrah Browning, MD;  Location: [facility] OR;  Service: Gynecology;  Laterality: Bilateral;  OPEN TOTAL HYSTERECTOMY WITH BILATERAL SALPINGECTOMY   THYROID  LOBECTOMY Right 08/28/2016   Procedure: RIGHT THYROID  LOBECTOMY;  Surgeon: Krystal Spinner, MD;  Location: [facility];  Service: General;  Laterality: Right;   WISDOM TOOTH EXTRACTION      Family History  Problem Relation Age of Onset   Diabetes Maternal Grandmother    Heart disease Maternal Grandfather  Melanoma Maternal Grandfather    Breast cancer Paternal Grandmother    Bladder Cancer Paternal Grandmother    Heart disease Paternal Grandmother    Breast cancer Maternal Aunt    Thyroid  cancer Maternal Aunt    Thyroid  cancer  Maternal Uncle    Colon cancer Neg Hx    Pancreatic cancer Neg Hx    Ovarian cancer Neg Hx    Endometrial cancer Neg Hx    Hypertension Neg Hx    Prostate cancer Neg Hx     Social History   Socioeconomic History   Marital status: Married    Spouse name: Not on file   Number of children: Not on file   Years of education: Not on file   Highest education level: Not on file  Occupational History   Not on file  Tobacco Use   Smoking status: Never   Smokeless tobacco: Never  Vaping Use   Vaping status: Never Used  Substance and Sexual Activity   Alcohol use: No   Drug use: Never   Sexual activity: Not on file  Other Topics Concern   Not on file  Social History Narrative   Not on file   Social Drivers of Health   Financial Resource Strain: Low Risk  (05/06/2023)   Received from [facility]   Overall Financial Resource Strain (CARDIA)    Difficulty of Paying Living Expenses: Not hard at all  Food Insecurity: No Food Insecurity (05/06/2023)   Received from [facility]   Hunger Vital Sign    Within the past 12 months, you worried that your food would run out before you got the money to buy more.: Never true    Within the past 12 months, the food you bought just didn't last and you didn't have money to get more.: Never true  Transportation Needs: No Transportation Needs (05/06/2023)   Received from [facility] - Transportation    In the past 12 months, has lack of transportation kept you from medical appointments or from getting medications?: No    Lack of Transportation (Non-Medical): No  Physical Activity: Insufficiently Active (03/29/2021)   Received from [facility]   Exercise Vital Sign    On average, how many days per week do you engage in moderate to strenuous exercise (like a brisk walk)?: 2 days    On average, how many minutes do you engage in exercise at this level?: 30 min  Stress:  Stress Concern Present (03/29/2021)   Received from [facility] - Occupational Stress Questionnaire    Feeling of Stress : To some extent  Social Connections: Not on file    Current Medications:  Current Outpatient Medications:    aspirin-acetaminophen -caffeine (EXCEDRIN MIGRAINE) 250-250-65 MG tablet, Take 1-2 tablets by mouth every 6 (six) hours as needed for headache. (Patient not taking: Reported on 04/09/2024), Disp: , Rfl:    fluticasone  (FLONASE ) 50 MCG/ACT nasal spray, Place 1 spray into both nostrils daily as needed for allergies. (Patient not taking: Reported on 04/09/2024), Disp: , Rfl:    ibuprofen  (ADVIL ) 800 MG tablet, Take 1 tablet (800 mg total) by mouth 3 (three) times daily. (Patient not taking: Reported on 04/09/2024), Disp: 30 tablet, Rfl: 0   levothyroxine  (SYNTHROID ) 50 MCG tablet, Take 50 mcg by mouth as directed. Takes one tablet 5 days a week  and takes two tablets two days a week  every morning, Disp: , Rfl:    liothyronine  (CYTOMEL ) 5 MCG tablet, Take 5 mcg by mouth daily., Disp: , Rfl:    Magnesium  Glycinate 120 MG CAPS, Take 2-3 capsules by mouth at bedtime., Disp: , Rfl:    oxyCODONE  (OXY IR/ROXICODONE ) 5 MG immediate release tablet, Take 1-2 tablets (5-10 mg total) by mouth every 4 (four) hours as needed for moderate pain (pain score 4-6)., Disp: 30 tablet, Rfl: 0   Pseudoephedrine-Naproxen Na (ALEVE-D SINUS & COLD) 120-220 MG TB12, Take 1 tablet by mouth daily as needed (for congestion)., Disp: , Rfl:   Review of Symptoms: Pertinent positives as per HPI.  Physical Exam: Deferred given limitations of phone visit.  Laboratory & Radiologic Studies: Immunohistochemical and morphometric analysis performed manually.  Estrogen Receptor:       POSITVE, 30%, WEAK TO STRONG STAINING  Reference Range Estrogen Receptor       Negative  0%       Positive  >1%  All controls stained appropriately.    Chest  xray: 8/29 No suspicious cardiopulmonary disease.   Left scapula/acromion sclerotic lesion, stable to prior likely benign bone island.  Assessment & Plan: Diane Russell is a 48 y.o. 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   Discussed getting LH and FSH to see if she is perimenopausal.  Her mom thinks that she went through menopause around 41 but does not remember having any significant symptoms. Viewed tumor is weak to strongly estrogen receptor positive, 30% Chest x-ray is normal.  MRI reviewed. Still waiting on genetic testing.  I discussed the assessment and treatment plan with the patient. The patient was provided with an opportunity to ask questions and all were answered. The patient agreed with the plan and demonstrated an understanding of the instructions.   The patient was advised to call back or see an in-person evaluation if the symptoms worsen or if the condition fails to improve as anticipated.   10 minutes of total time was spent for this patient encounter, including preparation, phone counseling with the patient and coordination of care, and documentation of the encounter.   Comer Dollar, MD  Division of Gynecologic Oncology  Department of Obstetrics and Gynecology  [facility]

## 2024-04-28 ENCOUNTER — Inpatient Hospital Stay: Payer: PRIVATE HEALTH INSURANCE

## 2024-04-28 DIAGNOSIS — D440 Neoplasm of uncertain behavior of thyroid gland: Secondary | ICD-10-CM

## 2024-04-28 DIAGNOSIS — D390 Neoplasm of uncertain behavior of uterus: Secondary | ICD-10-CM | POA: Diagnosis present

## 2024-04-29 ENCOUNTER — Ambulatory Visit: Payer: Self-pay | Admitting: Gynecologic Oncology

## 2024-04-29 LAB — FOLLICLE STIMULATING HORMONE: FSH: 7.3 m[IU]/mL

## 2024-04-29 LAB — LUTEINIZING HORMONE: LH: 3.6 m[IU]/mL

## 2024-05-18 ENCOUNTER — Encounter: Payer: Self-pay | Admitting: Gynecologic Oncology

## 2024-10-09 ENCOUNTER — Ambulatory Visit (HOSPITAL_COMMUNITY): Payer: PRIVATE HEALTH INSURANCE
